# Patient Record
Sex: Female | Born: 1935 | Race: White | Hispanic: No | Marital: Married | Smoking: Never smoker
Health system: Southern US, Community
[De-identification: ages and names within clinical notes are randomized; demographics above are authoritative.]

## PROBLEM LIST (undated history)

## (undated) DIAGNOSIS — C801 Malignant (primary) neoplasm, unspecified: Secondary | ICD-10-CM

## (undated) DIAGNOSIS — N390 Urinary tract infection, site not specified: Secondary | ICD-10-CM

## (undated) DIAGNOSIS — F419 Anxiety disorder, unspecified: Secondary | ICD-10-CM

## (undated) DIAGNOSIS — I471 Supraventricular tachycardia, unspecified: Secondary | ICD-10-CM

## (undated) DIAGNOSIS — I10 Essential (primary) hypertension: Secondary | ICD-10-CM

## (undated) DIAGNOSIS — J309 Allergic rhinitis, unspecified: Secondary | ICD-10-CM

## (undated) DIAGNOSIS — G250 Essential tremor: Secondary | ICD-10-CM

## (undated) DIAGNOSIS — K5790 Diverticulosis of intestine, part unspecified, without perforation or abscess without bleeding: Secondary | ICD-10-CM

## (undated) DIAGNOSIS — R809 Proteinuria, unspecified: Secondary | ICD-10-CM

## (undated) DIAGNOSIS — E538 Deficiency of other specified B group vitamins: Secondary | ICD-10-CM

## (undated) DIAGNOSIS — N189 Chronic kidney disease, unspecified: Secondary | ICD-10-CM

## (undated) DIAGNOSIS — N6019 Diffuse cystic mastopathy of unspecified breast: Secondary | ICD-10-CM

## (undated) DIAGNOSIS — F329 Major depressive disorder, single episode, unspecified: Secondary | ICD-10-CM

## (undated) DIAGNOSIS — IMO0002 Reserved for concepts with insufficient information to code with codable children: Secondary | ICD-10-CM

## (undated) DIAGNOSIS — R4189 Other symptoms and signs involving cognitive functions and awareness: Secondary | ICD-10-CM

## (undated) DIAGNOSIS — E785 Hyperlipidemia, unspecified: Secondary | ICD-10-CM

## (undated) DIAGNOSIS — F32A Depression, unspecified: Secondary | ICD-10-CM

## (undated) DIAGNOSIS — Z972 Presence of dental prosthetic device (complete) (partial): Secondary | ICD-10-CM

## (undated) DIAGNOSIS — E119 Type 2 diabetes mellitus without complications: Secondary | ICD-10-CM

## (undated) DIAGNOSIS — F0280 Dementia in other diseases classified elsewhere without behavioral disturbance: Secondary | ICD-10-CM

## (undated) DIAGNOSIS — D649 Anemia, unspecified: Secondary | ICD-10-CM

## (undated) DIAGNOSIS — K219 Gastro-esophageal reflux disease without esophagitis: Secondary | ICD-10-CM

## (undated) DIAGNOSIS — F319 Bipolar disorder, unspecified: Secondary | ICD-10-CM

## (undated) HISTORY — PX: ABDOMINAL HYSTERECTOMY: SHX81

## (undated) HISTORY — PX: APPENDECTOMY: SHX54

## (undated) HISTORY — PX: CHOLECYSTECTOMY: SHX55

---

## 2004-09-25 ENCOUNTER — Inpatient Hospital Stay: Payer: Self-pay | Admitting: Internal Medicine

## 2004-09-25 ENCOUNTER — Other Ambulatory Visit: Payer: Self-pay

## 2008-10-21 HISTORY — PX: BLADDER SURGERY: SHX569

## 2009-07-13 ENCOUNTER — Ambulatory Visit: Payer: Self-pay | Admitting: Internal Medicine

## 2009-07-23 ENCOUNTER — Ambulatory Visit: Payer: Self-pay | Admitting: Internal Medicine

## 2009-07-27 ENCOUNTER — Ambulatory Visit: Payer: Self-pay | Admitting: Internal Medicine

## 2009-08-23 ENCOUNTER — Ambulatory Visit: Payer: Self-pay | Admitting: Internal Medicine

## 2009-09-26 ENCOUNTER — Ambulatory Visit: Payer: Self-pay | Admitting: Family Medicine

## 2009-10-25 ENCOUNTER — Ambulatory Visit: Payer: Self-pay | Admitting: Family Medicine

## 2009-11-17 ENCOUNTER — Ambulatory Visit: Payer: Self-pay | Admitting: Internal Medicine

## 2009-11-20 ENCOUNTER — Ambulatory Visit: Payer: Self-pay | Admitting: Family Medicine

## 2010-02-20 ENCOUNTER — Ambulatory Visit: Payer: Self-pay | Admitting: Internal Medicine

## 2010-03-22 ENCOUNTER — Ambulatory Visit: Payer: Self-pay | Admitting: Internal Medicine

## 2010-06-12 ENCOUNTER — Emergency Department: Payer: Self-pay | Admitting: Emergency Medicine

## 2010-11-21 ENCOUNTER — Ambulatory Visit: Payer: Self-pay | Admitting: Internal Medicine

## 2011-11-22 ENCOUNTER — Ambulatory Visit: Payer: Self-pay | Admitting: Internal Medicine

## 2012-12-09 ENCOUNTER — Ambulatory Visit: Payer: Self-pay | Admitting: Internal Medicine

## 2013-12-08 DIAGNOSIS — E1169 Type 2 diabetes mellitus with other specified complication: Secondary | ICD-10-CM | POA: Insufficient documentation

## 2013-12-08 DIAGNOSIS — N183 Chronic kidney disease, stage 3 unspecified: Secondary | ICD-10-CM | POA: Insufficient documentation

## 2013-12-08 DIAGNOSIS — E1129 Type 2 diabetes mellitus with other diabetic kidney complication: Secondary | ICD-10-CM | POA: Insufficient documentation

## 2013-12-31 ENCOUNTER — Ambulatory Visit: Payer: Self-pay | Admitting: Internal Medicine

## 2014-01-07 DIAGNOSIS — F0280 Dementia in other diseases classified elsewhere without behavioral disturbance: Secondary | ICD-10-CM | POA: Insufficient documentation

## 2014-01-25 ENCOUNTER — Encounter: Payer: Self-pay | Admitting: Neurology

## 2014-01-26 ENCOUNTER — Ambulatory Visit: Payer: Self-pay | Admitting: Neurology

## 2014-02-20 ENCOUNTER — Encounter: Payer: Self-pay | Admitting: Neurology

## 2014-03-25 DIAGNOSIS — Z794 Long term (current) use of insulin: Secondary | ICD-10-CM | POA: Insufficient documentation

## 2014-07-07 DIAGNOSIS — R079 Chest pain, unspecified: Secondary | ICD-10-CM | POA: Insufficient documentation

## 2014-11-09 ENCOUNTER — Ambulatory Visit
Admit: 2014-11-09 | Disposition: A | Discharge status: Home / Self Care | Payer: Self-pay | Attending: Gastroenterology | Admitting: Gastroenterology

## 2014-11-09 DIAGNOSIS — Z86010 Personal history of colonic polyps: Secondary | ICD-10-CM | POA: Insufficient documentation

## 2014-11-09 HISTORY — PX: COLONOSCOPY: SHX174

## 2014-11-15 LAB — SURGICAL PATHOLOGY

## 2014-12-13 ENCOUNTER — Other Ambulatory Visit: Payer: Self-pay | Admitting: Internal Medicine

## 2014-12-13 DIAGNOSIS — Z1231 Encounter for screening mammogram for malignant neoplasm of breast: Secondary | ICD-10-CM

## 2014-12-22 ENCOUNTER — Ambulatory Visit
Admission: RE | Admit: 2014-12-22 | Discharge: 2014-12-22 | Disposition: A | Discharge status: Home / Self Care | Payer: Medicare Other | Source: Ambulatory Visit | Attending: Internal Medicine | Admitting: Internal Medicine

## 2014-12-22 DIAGNOSIS — Z1231 Encounter for screening mammogram for malignant neoplasm of breast: Secondary | ICD-10-CM | POA: Diagnosis not present

## 2014-12-22 HISTORY — DX: Malignant (primary) neoplasm, unspecified: C80.1

## 2015-02-14 ENCOUNTER — Other Ambulatory Visit: Payer: Self-pay | Admitting: Neurology

## 2015-02-14 DIAGNOSIS — R296 Repeated falls: Secondary | ICD-10-CM

## 2015-02-16 DIAGNOSIS — R55 Syncope and collapse: Secondary | ICD-10-CM | POA: Insufficient documentation

## 2015-02-18 ENCOUNTER — Ambulatory Visit
Admission: RE | Admit: 2015-02-18 | Discharge: 2015-02-18 | Disposition: A | Discharge status: Home / Self Care | Payer: Medicare Other | Source: Ambulatory Visit | Attending: Neurology | Admitting: Neurology

## 2015-02-18 DIAGNOSIS — I739 Peripheral vascular disease, unspecified: Secondary | ICD-10-CM | POA: Diagnosis not present

## 2015-02-18 DIAGNOSIS — I6529 Occlusion and stenosis of unspecified carotid artery: Secondary | ICD-10-CM | POA: Diagnosis not present

## 2015-02-18 DIAGNOSIS — R296 Repeated falls: Secondary | ICD-10-CM | POA: Insufficient documentation

## 2015-04-04 ENCOUNTER — Encounter: Payer: Self-pay | Admitting: *Deleted

## 2015-04-05 ENCOUNTER — Ambulatory Visit: Payer: Medicare Other | Admitting: Anesthesiology

## 2015-04-05 ENCOUNTER — Ambulatory Visit
Admission: RE | Admit: 2015-04-05 | Discharge: 2015-04-05 | Disposition: A | Discharge status: Home / Self Care | Payer: Medicare Other | Source: Ambulatory Visit | Attending: Gastroenterology | Admitting: Gastroenterology

## 2015-04-05 ENCOUNTER — Encounter
Admission: RE | Disposition: A | Discharge status: Home / Self Care | Payer: Self-pay | Source: Ambulatory Visit | Attending: Gastroenterology

## 2015-04-05 DIAGNOSIS — G250 Essential tremor: Secondary | ICD-10-CM | POA: Insufficient documentation

## 2015-04-05 DIAGNOSIS — I129 Hypertensive chronic kidney disease with stage 1 through stage 4 chronic kidney disease, or unspecified chronic kidney disease: Secondary | ICD-10-CM | POA: Insufficient documentation

## 2015-04-05 DIAGNOSIS — E1122 Type 2 diabetes mellitus with diabetic chronic kidney disease: Secondary | ICD-10-CM | POA: Diagnosis not present

## 2015-04-05 DIAGNOSIS — K5730 Diverticulosis of large intestine without perforation or abscess without bleeding: Secondary | ICD-10-CM | POA: Insufficient documentation

## 2015-04-05 DIAGNOSIS — F419 Anxiety disorder, unspecified: Secondary | ICD-10-CM | POA: Diagnosis not present

## 2015-04-05 DIAGNOSIS — I471 Supraventricular tachycardia: Secondary | ICD-10-CM | POA: Diagnosis not present

## 2015-04-05 DIAGNOSIS — Z7982 Long term (current) use of aspirin: Secondary | ICD-10-CM | POA: Insufficient documentation

## 2015-04-05 DIAGNOSIS — E538 Deficiency of other specified B group vitamins: Secondary | ICD-10-CM | POA: Insufficient documentation

## 2015-04-05 DIAGNOSIS — D120 Benign neoplasm of cecum: Secondary | ICD-10-CM | POA: Insufficient documentation

## 2015-04-05 DIAGNOSIS — Z86010 Personal history of colonic polyps: Secondary | ICD-10-CM | POA: Insufficient documentation

## 2015-04-05 DIAGNOSIS — E785 Hyperlipidemia, unspecified: Secondary | ICD-10-CM | POA: Insufficient documentation

## 2015-04-05 DIAGNOSIS — N189 Chronic kidney disease, unspecified: Secondary | ICD-10-CM | POA: Insufficient documentation

## 2015-04-05 DIAGNOSIS — Z888 Allergy status to other drugs, medicaments and biological substances status: Secondary | ICD-10-CM | POA: Insufficient documentation

## 2015-04-05 DIAGNOSIS — Z1211 Encounter for screening for malignant neoplasm of colon: Secondary | ICD-10-CM | POA: Insufficient documentation

## 2015-04-05 DIAGNOSIS — Z885 Allergy status to narcotic agent status: Secondary | ICD-10-CM | POA: Insufficient documentation

## 2015-04-05 DIAGNOSIS — G3184 Mild cognitive impairment, so stated: Secondary | ICD-10-CM | POA: Insufficient documentation

## 2015-04-05 DIAGNOSIS — K6389 Other specified diseases of intestine: Secondary | ICD-10-CM | POA: Insufficient documentation

## 2015-04-05 DIAGNOSIS — F319 Bipolar disorder, unspecified: Secondary | ICD-10-CM | POA: Diagnosis not present

## 2015-04-05 DIAGNOSIS — Z87440 Personal history of urinary (tract) infections: Secondary | ICD-10-CM | POA: Diagnosis not present

## 2015-04-05 DIAGNOSIS — Z85828 Personal history of other malignant neoplasm of skin: Secondary | ICD-10-CM | POA: Insufficient documentation

## 2015-04-05 DIAGNOSIS — K219 Gastro-esophageal reflux disease without esophagitis: Secondary | ICD-10-CM | POA: Diagnosis not present

## 2015-04-05 HISTORY — DX: Allergic rhinitis, unspecified: J30.9

## 2015-04-05 HISTORY — DX: Other symptoms and signs involving cognitive functions and awareness: R41.89

## 2015-04-05 HISTORY — PX: COLONOSCOPY WITH PROPOFOL: SHX5780

## 2015-04-05 HISTORY — DX: Diffuse cystic mastopathy of unspecified breast: N60.19

## 2015-04-05 HISTORY — DX: Supraventricular tachycardia, unspecified: I47.10

## 2015-04-05 HISTORY — DX: Hyperlipidemia, unspecified: E78.5

## 2015-04-05 HISTORY — DX: Essential (primary) hypertension: I10

## 2015-04-05 HISTORY — DX: Reserved for concepts with insufficient information to code with codable children: IMO0002

## 2015-04-05 HISTORY — DX: Anemia, unspecified: D64.9

## 2015-04-05 HISTORY — DX: Supraventricular tachycardia: I47.1

## 2015-04-05 HISTORY — DX: Type 2 diabetes mellitus without complications: E11.9

## 2015-04-05 HISTORY — DX: Proteinuria, unspecified: R80.9

## 2015-04-05 HISTORY — DX: Deficiency of other specified B group vitamins: E53.8

## 2015-04-05 HISTORY — DX: Chronic kidney disease, unspecified: N18.9

## 2015-04-05 HISTORY — DX: Urinary tract infection, site not specified: N39.0

## 2015-04-05 HISTORY — DX: Anxiety disorder, unspecified: F41.9

## 2015-04-05 HISTORY — DX: Bipolar disorder, unspecified: F31.9

## 2015-04-05 HISTORY — DX: Essential tremor: G25.0

## 2015-04-05 HISTORY — DX: Gastro-esophageal reflux disease without esophagitis: K21.9

## 2015-04-05 HISTORY — DX: Diverticulosis of intestine, part unspecified, without perforation or abscess without bleeding: K57.90

## 2015-04-05 LAB — GLUCOSE, CAPILLARY: Glucose-Capillary: 153 mg/dL — ABNORMAL HIGH (ref 65–99)

## 2015-04-05 SURGERY — COLONOSCOPY WITH PROPOFOL
Anesthesia: General

## 2015-04-05 MED ORDER — SODIUM CHLORIDE 0.9 % IV SOLN
INTRAVENOUS | Status: DC
  Administered 2015-04-05: 1000 mL via INTRAVENOUS

## 2015-04-05 MED ORDER — PROPOFOL INFUSION 10 MG/ML OPTIME
INTRAVENOUS | Status: DC | PRN
  Administered 2015-04-05: 140 ug/kg/min via INTRAVENOUS

## 2015-04-05 MED ORDER — MIDAZOLAM HCL 5 MG/5ML IJ SOLN
INTRAMUSCULAR | Status: DC | PRN
  Administered 2015-04-05: 1 mg via INTRAVENOUS

## 2015-04-05 MED ORDER — FENTANYL CITRATE (PF) 100 MCG/2ML IJ SOLN
INTRAMUSCULAR | Status: DC | PRN
  Administered 2015-04-05: 50 ug via INTRAVENOUS

## 2015-04-05 MED ORDER — PROPOFOL 10 MG/ML IV BOLUS
INTRAVENOUS | Status: DC | PRN
  Administered 2015-04-05: 30 mg via INTRAVENOUS

## 2015-04-05 NOTE — Anesthesia Postprocedure Evaluation (Signed)
  Anesthesia Post-op Note  Patient: Sharon Dickson  Procedure(s) Performed: Procedure(s): COLONOSCOPY WITH PROPOFOL (N/A)  Anesthesia type:General  Patient location: PACU  Post pain: Pain level controlled  Post assessment: Post-op Vital signs reviewed, Patient's Cardiovascular Status Stable, Respiratory Function Stable, Patent Airway and No signs of Nausea or vomiting  Post vital signs: Reviewed and stable  Last Vitals:  Filed Vitals:   04/05/15 1036  BP: 164/85  Temp: 37.2 C  Resp: 20    Level of consciousness: awake, alert  and patient cooperative  Complications: No apparent anesthesia complications

## 2015-04-05 NOTE — Transfer of Care (Signed)
Immediate Anesthesia Transfer of Care Note  Patient: Sharon Dickson  Procedure(s) Performed: Procedure(s): COLONOSCOPY WITH PROPOFOL (N/A)  Patient Location: PACU  Anesthesia Type:General  Level of Consciousness: sedated  Airway & Oxygen Therapy: Patient Spontanous Breathing  Post-op Assessment: Report given to RN  Post vital signs: stable  Last Vitals:  Filed Vitals:   04/05/15 1036  BP: 164/85  Temp: 37.2 C  Resp: 20    Complications: No apparent anesthesia complications

## 2015-04-05 NOTE — Anesthesia Preprocedure Evaluation (Addendum)
Anesthesia Evaluation  Patient identified by MRN, date of birth, ID band Patient awake    Reviewed: Allergy & Precautions, NPO status , Patient's Chart, lab work & pertinent test results  History of Anesthesia Complications Negative for: history of anesthetic complications  Airway Mallampati: II  TM Distance: >3 FB Neck ROM: Full    Dental  (+) Partial Upper, Missing   Pulmonary neg pulmonary ROS,           Cardiovascular hypertension, Pt. on medications and Pt. on home beta blockers + dysrhythmias (Hx of SVT) Supra Ventricular Tachycardia      Neuro/Psych Anxiety Bipolar Disorder PT with memory loss.     GI/Hepatic GERD  Medicated,  Endo/Other  diabetes, Type 2, Oral Hypoglycemic Agents  Renal/GU CRFRenal disease     Musculoskeletal   Abdominal   Peds  Hematology   Anesthesia Other Findings   Reproductive/Obstetrics                            Anesthesia Physical Anesthesia Plan  ASA: III  Anesthesia Plan: General   Post-op Pain Management:    Induction: Intravenous  Airway Management Planned:   Additional Equipment:   Intra-op Plan:   Post-operative Plan:   Informed Consent: I have reviewed the patients History and Physical, chart, labs and discussed the procedure including the risks, benefits and alternatives for the proposed anesthesia with the patient or authorized representative who has indicated his/her understanding and acceptance.     Plan Discussed with:   Anesthesia Plan Comments:         Anesthesia Quick Evaluation

## 2015-04-05 NOTE — Op Note (Signed)
[facility] Gastroenterology Patient Name: Sharon Dickson Procedure Date: 04/05/2015 11:05 AM MRN: [id number] Account #: [id number] Date of Birth: [birth year] Admit Type: Outpatient Age: 81 Room: [facility] ENDO ROOM 3 Gender: Female Note Status: Finalized Procedure:         Colonoscopy Indications:       Personal history of colonic polyps Providers:         Lollie Sails, MD Referring MD:      Christena Flake. Raechel Ache, MD (Referring MD) Medicines:         Monitored Anesthesia Care Complications:     No immediate complications. Procedure:         Pre-Anesthesia Assessment:                    - ASA Grade Assessment: III - A patient with severe                     systemic disease.                    After obtaining informed consent, the colonoscope was                     passed under direct vision. Throughout the procedure, the                     patient's blood pressure, pulse, and oxygen saturations                     were monitored continuously. The Colonoscope was                     introduced through the anus and advanced to the the cecum,                     identified by appendiceal orifice and ileocecal valve. The                     colonoscopy was unusually difficult due to multiple                     diverticula in the colon and a tortuous colon. Successful                     completion of the procedure was aided by changing the                     patient to a supine position, changing the patient to a                     prone position and using manual pressure. The patient                     tolerated the procedure well. Findings:      The sigmoid colon, descending colon and transverse colon were       significantly redundant.      Many small and large-mouthed diverticula were found in the sigmoid       colon, in the descending colon, in the transverse colon and at the       hepatic flexure.      A 20 mm polyp was found in the cecum. The polyp was  sessile. Biopsies       were taken with a cold forceps for histology.  The digital rectal exam was normal.      A Carbon medium-mouthed diverticula were found in the sigmoid colon and in       the distal sigmoid colon. Purulent discharge was seen in association       with the diverticular opening, suspicious of diverticulitis.      the area of previously noted granular tissue in the sigmoid adjacent to       a diverticular opening was not noted. Impression:        - Redundant colon.                    - Diverticulosis in the sigmoid colon, in the descending                     colon, in the transverse colon and at the hepatic flexure.                    - One 20 mm polyp in the cecum. Biopsied.                    - Mild diverticulosis in the sigmoid colon and in the                     distal sigmoid colon. Purulent discharge was seen in                     association with the diverticular opening, suspicious of                     diverticulitis. Recommendation:    - Await pathology results.                    - Discharge patient to home.                    - Refer to a colo-rectal surgeon at appointment to be                     scheduled. Discussed with Dr Rochel Brome. Procedure Code(s): --- Professional ---                    [phone], Colonoscopy, flexible; with biopsy, single or                     multiple Diagnosis Code(s): --- Professional ---                    211.3, Benign neoplasm of colon                    V12.72, Personal history of colonic polyps                    562.10, Diverticulosis of colon (without mention of                     hemorrhage)                    751.5, Other anomalies of intestine CPT copyright 2014 American [employer]. All rights reserved. The codes documented in this report are preliminary and upon coder review may  be revised to meet current compliance requirements. Lollie Sails, MD 04/05/2015 12:09:04 PM This report has been  signed electronically. Number of Addenda: 0 Note Initiated On: 04/05/2015 11:05  AM Scope Withdrawal Time: 0 hours 15 minutes 51 seconds  Total Procedure Duration: 0 hours 41 minutes 44 seconds       [facility]

## 2015-04-05 NOTE — H&P (Signed)
Outpatient short stay form Pre-procedure 04/05/2015 11:02 AM Lollie Sails MD  Primary Physician: Dr. Genene Churn  Reason for visit:  Colonoscopy  History of present illness:  Patient is a 81 year old female sending for colonoscopy in regards to a loosely noted polyp. His was partially removed quite difficult to get to. Surgical opinion was to try to repeat the colonoscopy. She tolerated her prep well. She is taking no coagulation medications or aspirin products.    Current facility-administered medications:  .  0.9 %  sodium chloride infusion, , Intravenous, Continuous, Lollie Sails, MD, Last Rate: 20 mL/hr at 04/05/15 1056, 1,000 mL at 04/05/15 1056  Prescriptions prior to admission  Medication Sig Dispense Refill Last Dose  . aspirin 81 MG tablet Take 81 mg by mouth daily.   Past Week at Unknown time  . Biotin 1 MG CAPS Take by mouth.   Past Week at Unknown time  . cholecalciferol (D-VI-SOL) 400 UNIT/ML LIQD Take 400 Units by mouth daily.   Past Week at Unknown time  . Cyanocobalamin 1000 MCG/ML KIT Inject 1,000 mcg as directed every 30 (thirty) days.   Past Week at Unknown time  . donepezil (ARICEPT) 10 MG tablet Take 10 mg by mouth at bedtime.   Past Week at Unknown time  . losartan (COZAAR) 50 MG tablet Take 50 mg by mouth daily.   Past Week at Unknown time  . metFORMIN (GLUCOPHAGE) 500 MG tablet Take 500 mg by mouth 2 (two) times daily with a meal.   Past Week at Unknown time  . omeprazole (PRILOSEC) 20 MG capsule Take 20 mg by mouth daily.   Past Week at Unknown time  . primidone (MYSOLINE) 50 MG tablet Take by mouth 2 times daily at 12 noon and 4 pm.   Past Week at Unknown time  . propranolol (INDERAL) 40 MG tablet Take 40 mg by mouth 2 (two) times daily.   Past Week at Unknown time  . venlafaxine (EFFEXOR) 75 MG tablet Take 75 mg by mouth 2 (two) times daily.   Past Week at Unknown time  . vitamin E 400 UNIT capsule Take 400 Units by mouth daily.   Past Week at Unknown  time  . fluticasone (FLONASE) 50 MCG/ACT nasal spray Place 2 sprays into both nostrils daily.   Not Taking at Unknown time     Allergies  Allergen Reactions  . Ceftin [Cefuroxime Axetil]   . Codeine   . Metformin And Related   . Paxil [Paroxetine Hcl]   . Prozac [Fluoxetine Hcl]      Past Medical History  Diagnosis Date  . Cancer     skin ca  . Chronic kidney disease   . GERD (gastroesophageal reflux disease)   . Bipolar disorder   . Anxiety   . Hypertension   . Diabetes mellitus without complication   . Hyperlipidemia   . Proteinuria   . Allergic rhinitis   . Benign essential tremor   . Anemia   . Vitamin B 12 deficiency   . Cognitive impairment   . UTI (urinary tract infection)   . SVT (supraventricular tachycardia)   . Cystocele   . Cystic disease of breast   . Diverticulosis     Review of systems:      Physical Exam    Heart and lungs: Regular rate and rhythm without rub or gallop, lungs are bilaterally clear    HEENT: Normocephalic atraumatic eyes are anicteric    Other:  Pertinant exam for procedure: Soft nontender nondistended bowel sounds positive normoactive    Planned proceedures: Colonoscopy and indicated procedures I have discussed the risks benefits and complications of procedures to include not limited to bleeding, infection, perforation and the risk of sedation and the patient wishes to proceed.    Lollie Sails, MD Gastroenterology 04/05/2015  11:02 AM

## 2015-04-06 ENCOUNTER — Encounter: Payer: Self-pay | Admitting: Gastroenterology

## 2015-04-06 LAB — SURGICAL PATHOLOGY

## 2015-04-13 ENCOUNTER — Emergency Department: Payer: Medicare Other

## 2015-04-13 ENCOUNTER — Observation Stay
Admission: EM | Admit: 2015-04-13 | Discharge: 2015-04-15 | Disposition: A | Discharge status: Home / Self Care | Payer: Medicare Other | Attending: Internal Medicine | Admitting: Internal Medicine

## 2015-04-13 ENCOUNTER — Other Ambulatory Visit: Payer: Self-pay

## 2015-04-13 ENCOUNTER — Encounter: Payer: Self-pay | Admitting: Emergency Medicine

## 2015-04-13 DIAGNOSIS — K219 Gastro-esophageal reflux disease without esophagitis: Principal | ICD-10-CM | POA: Insufficient documentation

## 2015-04-13 DIAGNOSIS — R001 Bradycardia, unspecified: Secondary | ICD-10-CM | POA: Diagnosis present

## 2015-04-13 DIAGNOSIS — Z794 Long term (current) use of insulin: Secondary | ICD-10-CM | POA: Insufficient documentation

## 2015-04-13 DIAGNOSIS — N189 Chronic kidney disease, unspecified: Secondary | ICD-10-CM | POA: Diagnosis not present

## 2015-04-13 DIAGNOSIS — Z87440 Personal history of urinary (tract) infections: Secondary | ICD-10-CM | POA: Insufficient documentation

## 2015-04-13 DIAGNOSIS — J309 Allergic rhinitis, unspecified: Secondary | ICD-10-CM | POA: Diagnosis not present

## 2015-04-13 DIAGNOSIS — K625 Hemorrhage of anus and rectum: Secondary | ICD-10-CM | POA: Diagnosis not present

## 2015-04-13 DIAGNOSIS — I2510 Atherosclerotic heart disease of native coronary artery without angina pectoris: Secondary | ICD-10-CM | POA: Diagnosis not present

## 2015-04-13 DIAGNOSIS — R42 Dizziness and giddiness: Secondary | ICD-10-CM | POA: Diagnosis not present

## 2015-04-13 DIAGNOSIS — Z9049 Acquired absence of other specified parts of digestive tract: Secondary | ICD-10-CM | POA: Diagnosis not present

## 2015-04-13 DIAGNOSIS — Z79899 Other long term (current) drug therapy: Secondary | ICD-10-CM | POA: Insufficient documentation

## 2015-04-13 DIAGNOSIS — G250 Essential tremor: Secondary | ICD-10-CM | POA: Insufficient documentation

## 2015-04-13 DIAGNOSIS — K449 Diaphragmatic hernia without obstruction or gangrene: Secondary | ICD-10-CM | POA: Diagnosis not present

## 2015-04-13 DIAGNOSIS — F319 Bipolar disorder, unspecified: Secondary | ICD-10-CM | POA: Insufficient documentation

## 2015-04-13 DIAGNOSIS — Z885 Allergy status to narcotic agent status: Secondary | ICD-10-CM | POA: Diagnosis not present

## 2015-04-13 DIAGNOSIS — E538 Deficiency of other specified B group vitamins: Secondary | ICD-10-CM | POA: Diagnosis not present

## 2015-04-13 DIAGNOSIS — E785 Hyperlipidemia, unspecified: Secondary | ICD-10-CM | POA: Diagnosis not present

## 2015-04-13 DIAGNOSIS — Z7951 Long term (current) use of inhaled steroids: Secondary | ICD-10-CM | POA: Insufficient documentation

## 2015-04-13 DIAGNOSIS — Z90710 Acquired absence of both cervix and uterus: Secondary | ICD-10-CM | POA: Insufficient documentation

## 2015-04-13 DIAGNOSIS — K921 Melena: Secondary | ICD-10-CM | POA: Insufficient documentation

## 2015-04-13 DIAGNOSIS — Z881 Allergy status to other antibiotic agents status: Secondary | ICD-10-CM | POA: Insufficient documentation

## 2015-04-13 DIAGNOSIS — K922 Gastrointestinal hemorrhage, unspecified: Secondary | ICD-10-CM | POA: Diagnosis present

## 2015-04-13 DIAGNOSIS — I129 Hypertensive chronic kidney disease with stage 1 through stage 4 chronic kidney disease, or unspecified chronic kidney disease: Secondary | ICD-10-CM | POA: Diagnosis not present

## 2015-04-13 DIAGNOSIS — I700 Atherosclerosis of aorta: Secondary | ICD-10-CM | POA: Insufficient documentation

## 2015-04-13 DIAGNOSIS — E1122 Type 2 diabetes mellitus with diabetic chronic kidney disease: Secondary | ICD-10-CM | POA: Insufficient documentation

## 2015-04-13 DIAGNOSIS — F419 Anxiety disorder, unspecified: Secondary | ICD-10-CM | POA: Diagnosis not present

## 2015-04-13 DIAGNOSIS — R948 Abnormal results of function studies of other organs and systems: Secondary | ICD-10-CM | POA: Insufficient documentation

## 2015-04-13 DIAGNOSIS — I471 Supraventricular tachycardia: Secondary | ICD-10-CM | POA: Diagnosis not present

## 2015-04-13 DIAGNOSIS — K5730 Diverticulosis of large intestine without perforation or abscess without bleeding: Secondary | ICD-10-CM | POA: Diagnosis not present

## 2015-04-13 DIAGNOSIS — D649 Anemia, unspecified: Secondary | ICD-10-CM | POA: Diagnosis present

## 2015-04-13 DIAGNOSIS — Z888 Allergy status to other drugs, medicaments and biological substances status: Secondary | ICD-10-CM | POA: Diagnosis not present

## 2015-04-13 DIAGNOSIS — Z85828 Personal history of other malignant neoplasm of skin: Secondary | ICD-10-CM | POA: Diagnosis not present

## 2015-04-13 DIAGNOSIS — R1031 Right lower quadrant pain: Secondary | ICD-10-CM | POA: Diagnosis not present

## 2015-04-13 DIAGNOSIS — M5126 Other intervertebral disc displacement, lumbar region: Secondary | ICD-10-CM | POA: Diagnosis not present

## 2015-04-13 LAB — COMPREHENSIVE METABOLIC PANEL
ALBUMIN: 3.3 g/dL — AB (ref 3.5–5.0)
ALT: 21 U/L (ref 14–54)
ANION GAP: 5 (ref 5–15)
AST: 28 U/L (ref 15–41)
Alkaline Phosphatase: 59 U/L (ref 38–126)
BUN: 17 mg/dL (ref 6–20)
CHLORIDE: 109 mmol/L (ref 101–111)
CO2: 23 mmol/L (ref 22–32)
Calcium: 8.5 mg/dL — ABNORMAL LOW (ref 8.9–10.3)
Creatinine, Ser: 1.04 mg/dL — ABNORMAL HIGH (ref 0.44–1.00)
GFR calc non Af Amer: 50 mL/min — ABNORMAL LOW (ref >60)
GFR, EST AFRICAN AMERICAN: 58 mL/min — AB (ref >60)
GLUCOSE: 191 mg/dL — AB (ref 65–99)
Potassium: 4 mmol/L (ref 3.5–5.1)
SODIUM: 137 mmol/L (ref 135–145)
Total Bilirubin: 0.3 mg/dL (ref 0.3–1.2)
Total Protein: 6.5 g/dL (ref 6.5–8.1)

## 2015-04-13 LAB — CBC
HCT: 30 % — ABNORMAL LOW (ref 35.0–47.0)
HEMOGLOBIN: 10.1 g/dL — AB (ref 12.0–16.0)
MCH: 30.5 pg (ref 26.0–34.0)
MCHC: 33.8 g/dL (ref 32.0–36.0)
MCV: 90.2 fL (ref 80.0–100.0)
Platelets: 306 10*3/uL (ref 150–440)
RBC: 3.33 MIL/uL — AB (ref 3.80–5.20)
RDW: 14.8 % — ABNORMAL HIGH (ref 11.5–14.5)
WBC: 6.1 10*3/uL (ref 3.6–11.0)

## 2015-04-13 LAB — TYPE AND SCREEN
ABO/RH(D): O NEG
Antibody Screen: NEGATIVE

## 2015-04-13 LAB — GLUCOSE, CAPILLARY: Glucose-Capillary: 76 mg/dL (ref 65–99)

## 2015-04-13 LAB — TROPONIN I

## 2015-04-13 LAB — ABO/RH: ABO/RH(D): O NEG

## 2015-04-13 MED ORDER — VENLAFAXINE HCL ER 75 MG PO CP24
75.0000 mg | ORAL_CAPSULE | Freq: Every evening | ORAL | Status: DC
  Administered 2015-04-13 – 2015-04-14 (×2): 75 mg via ORAL
  Filled 2015-04-13 (×2): qty 1

## 2015-04-13 MED ORDER — VENLAFAXINE HCL ER 75 MG PO CP24
150.0000 mg | ORAL_CAPSULE | Freq: Every morning | ORAL | Status: DC
  Administered 2015-04-14 – 2015-04-15 (×2): 150 mg via ORAL
  Filled 2015-04-13 (×2): qty 2

## 2015-04-13 MED ORDER — IOHEXOL 240 MG/ML SOLN
25.0000 mL | INTRAMUSCULAR | Status: AC
  Administered 2015-04-13 (×2): 25 mL via ORAL

## 2015-04-13 MED ORDER — PROPRANOLOL HCL 40 MG PO TABS
40.0000 mg | ORAL_TABLET | Freq: Two times a day (BID) | ORAL | Status: DC
  Administered 2015-04-13 – 2015-04-15 (×4): 40 mg via ORAL
  Filled 2015-04-13 (×4): qty 1

## 2015-04-13 MED ORDER — FLUTICASONE PROPIONATE 50 MCG/ACT NA SUSP
2.0000 | Freq: Every day | NASAL | Status: DC | PRN
  Filled 2015-04-13: qty 16

## 2015-04-13 MED ORDER — SODIUM CHLORIDE 0.9 % IV SOLN: INTRAVENOUS | Status: DC

## 2015-04-13 MED ORDER — IOHEXOL 240 MG/ML SOLN: 25.0000 mL | Freq: Once | INTRAMUSCULAR | Status: DC | PRN

## 2015-04-13 MED ORDER — ONDANSETRON HCL 4 MG PO TABS: 4.0000 mg | ORAL_TABLET | Freq: Four times a day (QID) | ORAL | Status: DC | PRN

## 2015-04-13 MED ORDER — INSULIN ASPART 100 UNIT/ML ~~LOC~~ SOLN: 0.0000 [IU] | Freq: Every day | SUBCUTANEOUS | Status: DC

## 2015-04-13 MED ORDER — ALBUTEROL SULFATE (2.5 MG/3ML) 0.083% IN NEBU: 2.5000 mg | INHALATION_SOLUTION | RESPIRATORY_TRACT | Status: DC | PRN

## 2015-04-13 MED ORDER — DEXTROSE-NACL 5-0.9 % IV SOLN
INTRAVENOUS | Status: DC
  Administered 2015-04-13 – 2015-04-15 (×3): via INTRAVENOUS

## 2015-04-13 MED ORDER — ACETAMINOPHEN 325 MG PO TABS: 650.0000 mg | ORAL_TABLET | Freq: Four times a day (QID) | ORAL | Status: DC | PRN

## 2015-04-13 MED ORDER — SODIUM CHLORIDE 0.9 % IV BOLUS (SEPSIS)
500.0000 mL | Freq: Once | INTRAVENOUS | Status: AC
  Administered 2015-04-13: 500 mL via INTRAVENOUS

## 2015-04-13 MED ORDER — ACETAMINOPHEN 650 MG RE SUPP: 650.0000 mg | Freq: Four times a day (QID) | RECTAL | Status: DC | PRN

## 2015-04-13 MED ORDER — PRIMIDONE 50 MG PO TABS
50.0000 mg | ORAL_TABLET | Freq: Two times a day (BID) | ORAL | Status: DC
  Administered 2015-04-13 – 2015-04-15 (×4): 50 mg via ORAL
  Filled 2015-04-13 (×5): qty 1

## 2015-04-13 MED ORDER — IOHEXOL 300 MG/ML  SOLN
75.0000 mL | Freq: Once | INTRAMUSCULAR | Status: AC | PRN
  Administered 2015-04-13: 75 mL via INTRAVENOUS

## 2015-04-13 MED ORDER — ONDANSETRON HCL 4 MG/2ML IJ SOLN: 4.0000 mg | Freq: Four times a day (QID) | INTRAMUSCULAR | Status: DC | PRN

## 2015-04-13 MED ORDER — DONEPEZIL HCL 5 MG PO TABS
10.0000 mg | ORAL_TABLET | Freq: Every day | ORAL | Status: DC
  Administered 2015-04-13 – 2015-04-14 (×2): 10 mg via ORAL
  Filled 2015-04-13 (×2): qty 2

## 2015-04-13 MED ORDER — PANTOPRAZOLE SODIUM 40 MG IV SOLR
40.0000 mg | Freq: Two times a day (BID) | INTRAVENOUS | Status: DC
  Administered 2015-04-13 – 2015-04-14 (×3): 40 mg via INTRAVENOUS
  Filled 2015-04-13 (×4): qty 40

## 2015-04-13 MED ORDER — INSULIN ASPART 100 UNIT/ML ~~LOC~~ SOLN
0.0000 [IU] | Freq: Three times a day (TID) | SUBCUTANEOUS | Status: DC
  Administered 2015-04-14 – 2015-04-15 (×3): 2 [IU] via SUBCUTANEOUS
  Filled 2015-04-13 (×3): qty 2

## 2015-04-13 MED ORDER — CIPROFLOXACIN HCL 250 MG PO TABS
500.0000 mg | ORAL_TABLET | Freq: Two times a day (BID) | ORAL | Status: DC
  Administered 2015-04-13 – 2015-04-15 (×4): 500 mg via ORAL
  Filled 2015-04-13 (×4): qty 2

## 2015-04-13 MED ORDER — LOSARTAN POTASSIUM 50 MG PO TABS
50.0000 mg | ORAL_TABLET | Freq: Every day | ORAL | Status: DC
  Administered 2015-04-13 – 2015-04-15 (×3): 50 mg via ORAL
  Filled 2015-04-13 (×3): qty 1

## 2015-04-13 NOTE — ED Notes (Signed)
BG 68

## 2015-04-13 NOTE — ED Notes (Signed)
Patient reports having colonoscopy 8 days ago. Noticed today having very dark stool.

## 2015-04-13 NOTE — ED Notes (Signed)
Pt drinking second bottle of contrast and then will go for CT Scan.

## 2015-04-13 NOTE — ED Notes (Signed)
Pt called out stating she felt her sugar was low. Went to check BG and it was 59. Will make Dr. Mariea Clonts aware.

## 2015-04-13 NOTE — ED Notes (Signed)
Pt had large amount of diarrhea after CT scan performed. Pt cleaned, linen changed. Stool loose, yellow/brown in nature. No blood or black tarry stool noted.

## 2015-04-13 NOTE — Progress Notes (Signed)
On call MD notified blood sugar low in ED and came up to 68 but inpatient orders for NPO. Telephone order received & read back for D5NS @ 75. Will continue to assess.

## 2015-04-13 NOTE — ED Provider Notes (Signed)
[facility] Emergency Department Provider Note   ____________________________________________  Time seen: Approximately 3:51 PM  I have reviewed the triage vital signs and the nursing notes.   HISTORY  Chief Complaint Rectal Bleeding    HPI Sharon Dickson is a 81 y.o. female with a history of anemia, 8 days S/P colonoscopy with polyp biopsy and initiation of Cipro and Flagyl for diverticulitis presenting with melena. Patient states that every day since her procedure she has been having black, tarry color-looking stools. She also has intermittent mild right lower quadrant pain; none now. She reports lightheadedness both at rest and with exertion. She denies any chest pain, palpitations, nausea or vomiting, abdominal distention,fever, chills.   Past Medical History  Diagnosis Date  . Cancer     skin ca  . Chronic kidney disease   . GERD (gastroesophageal reflux disease)   . Bipolar disorder   . Anxiety   . Hypertension   . Diabetes mellitus without complication   . Hyperlipidemia   . Proteinuria   . Allergic rhinitis   . Benign essential tremor   . Anemia   . Vitamin B 12 deficiency   . Cognitive impairment   . UTI (urinary tract infection)   . SVT (supraventricular tachycardia)   . Cystocele   . Cystic disease of breast   . Diverticulosis     There are no active problems to display for this patient.   Past Surgical History  Procedure Laterality Date  . Appendectomy    . Abdominal hysterectomy    . Cholecystectomy    . Bladder surgery  10/2008  . Colonoscopy  11/09/2014  . Colonoscopy with propofol N/A 04/05/2015    Procedure: COLONOSCOPY WITH PROPOFOL;  Surgeon: Lollie Sails, MD;  Location: [facility] ENDOSCOPY;  Service: Endoscopy;  Laterality: N/A;    Current Outpatient Rx  Name  Route  Sig  Dispense  Refill  . Biotin 1 MG CAPS   Oral   Take 1 mg by mouth daily.          . cholecalciferol (VITAMIN D) 1000 UNITS tablet    Oral   Take 1,000 Units by mouth daily.         . ciprofloxacin (CIPRO) 500 MG tablet   Oral   Take 500 mg by mouth 2 (two) times daily.         . cyanocobalamin (,VITAMIN B-12,) 1000 MCG/ML injection   Intramuscular   Inject 1,000 mcg into the muscle every 30 (thirty) days. Pt uses on the 3rd of every month.         . donepezil (ARICEPT) 10 MG tablet   Oral   Take 10 mg by mouth at bedtime.         . fluticasone (FLONASE) 50 MCG/ACT nasal spray   Each Nare   Place 2 sprays into both nostrils daily as needed for rhinitis.          Marland Kitchen insulin glargine (LANTUS) 100 UNIT/ML injection   Subcutaneous   Inject 14 Units into the skin at bedtime.         Marland Kitchen losartan (COZAAR) 50 MG tablet   Oral   Take 50 mg by mouth daily.         . metFORMIN (GLUCOPHAGE-XR) 500 MG 24 hr tablet   Oral   Take 500-1,000 mg by mouth 2 (two) times daily. Pt takes two in the morning and one in the evening.         Marland Kitchen  omeprazole (PRILOSEC) 20 MG capsule   Oral   Take 20 mg by mouth at bedtime.          . primidone (MYSOLINE) 50 MG tablet   Oral   Take 50 mg by mouth 2 (two) times daily.          . propranolol (INDERAL) 40 MG tablet   Oral   Take 40 mg by mouth 2 (two) times daily.         Marland Kitchen venlafaxine XR (EFFEXOR-XR) 75 MG 24 hr capsule   Oral   Take 75-150 mg by mouth 2 (two) times daily. Pt takes two in the morning and one at night.         . vitamin E 400 UNIT capsule   Oral   Take 400 Units by mouth daily.           Allergies Ceftin; Codeine; Paxil; and Prozac  Family History  Problem Relation Age of Onset  . Breast cancer Daughter 57    Social History Social History  Substance Use Topics  . Smoking status: Never Smoker   . Smokeless tobacco: Never Used  . Alcohol Use: No    Review of Systems Constitutional: No fever/chills. Eyes: No visual changes. ENT: No sore throat. Cardiovascular: Denies chest pain, palpitations. Positive  lightheadedness Respiratory: Denies shortness of breath.  No cough. Gastrointestinal: Intermittent right lower abdominal pain.  No nausea, no vomiting.  No diarrhea.  No constipation. Genitourinary: Negative for dysuria. Musculoskeletal: Negative for back pain. Skin: Negative for rash. Neurological: Negative for headaches, focal weakness or numbness.  10-point ROS otherwise negative.  ____________________________________________   PHYSICAL EXAM:  VITAL SIGNS: ED Triage Vitals  Enc Vitals Group     BP 04/13/15 1346 142/65 mmHg     Pulse Rate 04/13/15 1346 58     Resp 04/13/15 1346 18     Temp 04/13/15 1346 98.4 F (36.9 C)     Temp Source 04/13/15 1346 Oral     SpO2 04/13/15 1346 96 %     Weight 04/13/15 1346 126 lb (57.153 kg)     Height 04/13/15 1346 5\' 4"  (1.626 m)     Head Cir --      Peak Flow --      Pain Score 04/13/15 1346 0     Pain Loc --      Pain Edu? --      Excl. in [place]? --     Constitutional: Alert and oriented. Well appearing and in no acute distress. Answer question appropriately. Eyes: Conjunctivae are normal.  EOMI. Head: Atraumatic. Nose: No congestion/rhinnorhea. Mouth/Throat: Mucous membranes are moist.  Neck: No stridor.  Supple.   Cardiovascular: Normal rate, regular rhythm. No murmurs, rubs or gallops.  Respiratory: Normal respiratory effort.  No retractions. Lungs CTAB.  No wheezes, rales or ronchi. Gastrointestinal: Soft and nontender. No distention. No peritoneal signs. Genitourinary: Rectal exam shows nonthrombosed, nonbleeding external hemorrhoids. Green stool is guaiac negative. No palpable internal hemorrhoids. Musculoskeletal: No LE edema.  Neurologic:  Normal speech and language. No gross focal neurologic deficits are appreciated.  Skin:  Skin is warm, dry and intact. No rash noted. Psychiatric: Mood and affect are normal. Speech and behavior are normal.  Normal judgement.  ____________________________________________   LABS (all  labs ordered are listed, but only abnormal results are displayed)  Labs Reviewed  COMPREHENSIVE METABOLIC PANEL - Abnormal; Notable for the following:    Glucose, Bld 191 (*)    Creatinine, Ser 1.04 (*)  Calcium 8.5 (*)    Albumin 3.3 (*)    GFR calc non Af Amer 50 (*)    GFR calc Af Amer 58 (*)    All other components within normal limits  CBC - Abnormal; Notable for the following:    RBC 3.33 (*)    Hemoglobin 10.1 (*)    HCT 30.0 (*)    RDW 14.8 (*)    All other components within normal limits  TROPONIN I  TYPE AND SCREEN  ABO/RH   ____________________________________________  EKG  ED ECG REPORT I, Eula Listen, the attending physician, personally viewed and interpreted this ECG.   Date: 04/13/2015  EKG Time: 16:18  Rate: 58  Rhythm: sinus bradycardia  Axis: normal   Intervals:none  ST&T Change: Nonspecific T wave inversions in V1.  ____________________________________________  RADIOLOGY  Ct Abdomen Pelvis W Contrast  04/13/2015   CLINICAL DATA:  Patient reports having colonoscopy 8 days ago. Noticed today having very dark stool.  EXAM: CT ABDOMEN AND PELVIS WITH CONTRAST  TECHNIQUE: Multidetector CT imaging of the abdomen and pelvis was performed using the standard protocol following bolus administration of intravenous contrast.  CONTRAST:  29mL OMNIPAQUE IOHEXOL 300 MG/ML  SOLN  COMPARISON:  Ultrasound the abdomen 08/04/2009  FINDINGS: Lower chest: No pulmonary nodules, pleural effusions, or infiltrates. Coronary artery calcifications are present. Hiatal hernia is noted.  Upper abdomen: No focal abnormality identified within the liver. The gallbladder is absent. Small low-attenuation lesions are identified within the spleen which are too small to be characterized. No focal abnormality identified within the pancreas or adrenal glands. Small cyst is identified in the lower pole the right kidney. No hydronephrosis.  Gastrointestinal tract: Hiatal hernia.  Otherwise the stomach has a normal appearance. Small bowel loops are normal in appearance. Status post appendectomy. Numerous colonic diverticula are present. No associated inflammation or obstruction.  Pelvis: Status post hysterectomy. A 1.1 cm benign-appearing right adnexal mass is identified, likely ovarian cyst. No free pelvic fluid.  Retroperitoneum: There is dense atherosclerotic calcification of the tortuous abdominal aorta. No aneurysm. No retroperitoneal or mesenteric adenopathy.  Abdominal wall: Unremarkable.  Osseous structures: Degenerative changes are seen in the lower lumbar spine, particularly at L3-4 where there is disc protrusion. There is disc protrusion, and disc height loss, and vacuum disc phenomenon at L4-5. No suspicious lytic or blastic lesions are identified.  IMPRESSION: 1. Diverticulosis without acute diverticulitis. No colonic mass identified to account for the patient's symptoms. 2. Coronary artery disease. 3. Small splenic lesions are too small to characterize but favored to be benign. 4. Small cysts within the lower pole the right kidney. 5. Hiatal hernia. 6. Status post appendectomy. 7. Right adnexal cyst 1.1 cm. Given the benign appearance and size/menopausal status no further evaluation is necessary based on consensus criteria. 8. Degenerative disc disease. 9. Atherosclerotic calcification of the abdominal aorta.   Electronically Signed   By: Nolon Nations M.D.   On: 04/13/2015 18:42    ____________________________________________   PROCEDURES  Procedure(s) performed: None  Critical Care performed: No ____________________________________________   INITIAL IMPRESSION / ASSESSMENT AND PLAN / ED COURSE  Pertinent labs & imaging results that were available during my care of the patient were reviewed by me and considered in my medical decision making (see chart for details).  81 y.o. female status post recent colonic polyp biopsy, on antibiotics for diverticulitis,  presenting with melena and lightheadedness. The patient is hemodynamically stable, but her labs from triage due show an anemia with  a hematocrit of 30. She has a history of anemia we do not have any previous labs from her. She is not anticoagulated. At this time there is no evidence of GI bleed as her rectal exam does not show blood. She does not have any fever or tenderness in the abdomen, so acute surgical pathology including colonic perforation is unlikely. Consider bleeding as a complication of diverticulitis. Will plan CT of the abdomen, IV fluids for now. The patient has a type and screen and if she has any further episodes of bleeding we will plan transfusion but given that she is hemodynamically stable at this time we will hold off on transfusion.  ----------------------------------------- 6:53 PM on 04/13/2015 -----------------------------------------  The patient has been hemodynamically stable while she has been here. She has had another large bowel movement that had no gross signs of blood. Her CT scan does not show any acute causes for her bleeding. Plan to admit to the hospitalist for GI bleed with symptomatic anemia. ____________________________________________  FINAL CLINICAL IMPRESSION(S) / ED DIAGNOSES  Final diagnoses:  Melena  Symptomatic anemia      NEW MEDICATIONS STARTED DURING THIS VISIT:  New Prescriptions   No medications on file     Eula Listen, MD 04/13/15 [phone]

## 2015-04-13 NOTE — ED Notes (Signed)
Lab needs new blue top and new order for pt/inr

## 2015-04-13 NOTE — ED Notes (Signed)
Patient transported to CT 

## 2015-04-13 NOTE — H&P (Signed)
[place] [facility] NAME: Sharon Dickson    MR#:  [id number]  DATE OF BIRTH:  05/30/1934  DATE OF ADMISSION:  04/13/2015  PRIMARY CARE PHYSICIAN: Ezequiel Kayser, MD   REQUESTING/REFERRING PHYSICIAN: Eula Listen, MD  CHIEF COMPLAINT:   Chief Complaint  Patient presents with  . Rectal Bleeding   melena 8 days since colonoscopy  HISTORY OF PRESENT ILLNESS:  Sharon Dickson  is a 81 y.o. female with a known history of hypertension, diabetes, CKD and diverticulosis.the patient got a colonoscopy with polyp biopsy 8 days ago. She was started Cipro and Flagyl for diverticulosis. She presents in the ED with the melena for the past 8 days after colonoscopy. She also complains of intermittent mild right lower quadrant pain but not today. She has had another large bowel movement that had no gross signs of blood. The patient has generalized weakness and dizziness but denies any chest pain, palpitation, nausea or vomiting. She is not on any aspirin or blood thinner.  PAST MEDICAL HISTORY:   Past Medical History  Diagnosis Date  . Cancer     skin ca  . Chronic kidney disease   . GERD (gastroesophageal reflux disease)   . Bipolar disorder   . Anxiety   . Hypertension   . Diabetes mellitus without complication   . Hyperlipidemia   . Proteinuria   . Allergic rhinitis   . Benign essential tremor   . Anemia   . Vitamin B 12 deficiency   . Cognitive impairment   . UTI (urinary tract infection)   . SVT (supraventricular tachycardia)   . Cystocele   . Cystic disease of breast   . Diverticulosis     PAST SURGICAL HISTORY:   Past Surgical History  Procedure Laterality Date  . Appendectomy    . Abdominal hysterectomy    . Cholecystectomy    . Bladder surgery  10/2008  . Colonoscopy  11/09/2014  . Colonoscopy with propofol N/A 04/05/2015    Procedure: COLONOSCOPY WITH PROPOFOL;  Surgeon: Lollie Sails, MD;  Location: [facility] ENDOSCOPY;   Service: Endoscopy;  Laterality: N/A;    SOCIAL HISTORY:   Social History  Substance Use Topics  . Smoking status: Never Smoker   . Smokeless tobacco: Never Used  . Alcohol Use: No    FAMILY HISTORY:   Family History  Problem Relation Age of Onset  . Breast cancer Daughter 64    DRUG ALLERGIES:   Allergies  Allergen Reactions  . Ceftin [Cefuroxime Axetil] Nausea And Vomiting  . Codeine Nausea And Vomiting  . Paxil [Paroxetine Hcl] Other (See Comments)    Reaction:  Unknown   . Prozac [Fluoxetine Hcl] Other (See Comments)    Reaction:  Dizziness     REVIEW OF SYSTEMS:  CONSTITUTIONAL: No fever, generalized  weakness.  EYES: No blurred or double vision.  EARS, NOSE, AND THROAT: No tinnitus or ear pain.  RESPIRATORY: No cough, shortness of breath, wheezing or hemoptysis.  CARDIOVASCULAR: No chest pain, orthopnea, edema.  GASTROINTESTINAL: No nausea, vomiting, diarrhea or abdominal pain. But has melena.  GENITOURINARY: No dysuria, hematuria.  ENDOCRINE: No polyuria, nocturia,  HEMATOLOGY: No anemia, easy bruising or bleeding SKIN: No rash or lesion. MUSCULOSKELETAL: No joint pain or arthritis.   NEUROLOGIC: No tingling, numbness, weakness.  PSYCHIATRY: No anxiety or depression.   MEDICATIONS AT HOME:   Prior to Admission medications   Medication Sig Start Date End Date Taking? Authorizing Provider  Biotin  1 MG CAPS Take 1 mg by mouth daily.    Yes Historical Provider, MD  cholecalciferol (VITAMIN D) 1000 UNITS tablet Take 1,000 Units by mouth daily.   Yes Historical Provider, MD  ciprofloxacin (CIPRO) 500 MG tablet Take 500 mg by mouth 2 (two) times daily. 04/05/15 04/15/15 Yes Historical Provider, MD  cyanocobalamin (,VITAMIN B-12,) 1000 MCG/ML injection Inject 1,000 mcg into the muscle every 30 (thirty) days. Pt uses on the 3rd of every month.   Yes Historical Provider, MD  donepezil (ARICEPT) 10 MG tablet Take 10 mg by mouth at bedtime.   Yes Historical Provider, MD   fluticasone (FLONASE) 50 MCG/ACT nasal spray Place 2 sprays into both nostrils daily as needed for rhinitis.    Yes Historical Provider, MD  insulin glargine (LANTUS) 100 UNIT/ML injection Inject 14 Units into the skin at bedtime.   Yes Historical Provider, MD  losartan (COZAAR) 50 MG tablet Take 50 mg by mouth daily.   Yes Historical Provider, MD  metFORMIN (GLUCOPHAGE-XR) 500 MG 24 hr tablet Take 500-1,000 mg by mouth 2 (two) times daily. Pt takes two in the morning and one in the evening.   Yes Historical Provider, MD  omeprazole (PRILOSEC) 20 MG capsule Take 20 mg by mouth at bedtime.    Yes Historical Provider, MD  primidone (MYSOLINE) 50 MG tablet Take 50 mg by mouth 2 (two) times daily.    Yes Historical Provider, MD  propranolol (INDERAL) 40 MG tablet Take 40 mg by mouth 2 (two) times daily.   Yes Historical Provider, MD  venlafaxine XR (EFFEXOR-XR) 75 MG 24 hr capsule Take 75-150 mg by mouth 2 (two) times daily. Pt takes two in the morning and one at night.   Yes Historical Provider, MD  vitamin E 400 UNIT capsule Take 400 Units by mouth daily.   Yes Historical Provider, MD      VITAL SIGNS:  Blood pressure 142/65, pulse 58, temperature 98.4 F (36.9 C), temperature source Oral, resp. rate 18, height 5\' 4"  (1.626 m), weight 57.153 kg (126 lb), SpO2 96 %.  PHYSICAL EXAMINATION:  GENERAL:  81 y.o.-year-old patient lying in the bed with no acute distress.  EYES: Pupils equal, round, reactive to light and accommodation. No scleral icterus. Extraocular muscles intact.  HEENT: Head atraumatic, normocephalic. Oropharynx and nasopharynx clear.  NECK:  Supple, no jugular venous distention. No thyroid enlargement, no tenderness.  LUNGS: Normal breath sounds bilaterally, no wheezing, rales,rhonchi or crepitation. No use of accessory muscles of respiration.  CARDIOVASCULAR: S1, S2 normal. No murmurs, rubs, or gallops.  ABDOMEN: Soft, nontender, nondistended. Bowel sounds present. No  organomegaly or mass.  EXTREMITIES: No pedal edema, cyanosis, or clubbing.  NEUROLOGIC: Cranial nerves II through XII are intact. Muscle strength 5/5 in all extremities. Sensation intact. Gait not checked.  PSYCHIATRIC: The patient is alert and oriented x 3.  SKIN: No obvious rash, lesion, or ulcer.   LABORATORY PANEL:   CBC  Recent Labs Lab 04/13/15 1353  WBC 6.1  HGB 10.1*  HCT 30.0*  PLT 306   ------------------------------------------------------------------------------------------------------------------  Chemistries   Recent Labs Lab 04/13/15 1353  NA 137  K 4.0  CL 109  CO2 23  GLUCOSE 191*  BUN 17  CREATININE 1.04*  CALCIUM 8.5*  AST 28  ALT 21  ALKPHOS 59  BILITOT 0.3   ------------------------------------------------------------------------------------------------------------------  Cardiac Enzymes  Recent Labs Lab 04/13/15 1605  TROPONINI <0.03   ------------------------------------------------------------------------------------------------------------------  RADIOLOGY:  Ct Abdomen Pelvis W Contrast  04/13/2015  CLINICAL DATA:  Patient reports having colonoscopy 8 days ago. Noticed today having very dark stool.  EXAM: CT ABDOMEN AND PELVIS WITH CONTRAST  TECHNIQUE: Multidetector CT imaging of the abdomen and pelvis was performed using the standard protocol following bolus administration of intravenous contrast.  CONTRAST:  71mL OMNIPAQUE IOHEXOL 300 MG/ML  SOLN  COMPARISON:  Ultrasound the abdomen 08/04/2009  FINDINGS: Lower chest: No pulmonary nodules, pleural effusions, or infiltrates. Coronary artery calcifications are present. Hiatal hernia is noted.  Upper abdomen: No focal abnormality identified within the liver. The gallbladder is absent. Small low-attenuation lesions are identified within the spleen which are too small to be characterized. No focal abnormality identified within the pancreas or adrenal glands. Small cyst is identified in the  lower pole the right kidney. No hydronephrosis.  Gastrointestinal tract: Hiatal hernia. Otherwise the stomach has a normal appearance. Small bowel loops are normal in appearance. Status post appendectomy. Numerous colonic diverticula are present. No associated inflammation or obstruction.  Pelvis: Status post hysterectomy. A 1.1 cm benign-appearing right adnexal mass is identified, likely ovarian cyst. No free pelvic fluid.  Retroperitoneum: There is dense atherosclerotic calcification of the tortuous abdominal aorta. No aneurysm. No retroperitoneal or mesenteric adenopathy.  Abdominal wall: Unremarkable.  Osseous structures: Degenerative changes are seen in the lower lumbar spine, particularly at L3-4 where there is disc protrusion. There is disc protrusion, and disc height loss, and vacuum disc phenomenon at L4-5. No suspicious lytic or blastic lesions are identified.  IMPRESSION: 1. Diverticulosis without acute diverticulitis. No colonic mass identified to account for the patient's symptoms. 2. Coronary artery disease. 3. Small splenic lesions are too small to characterize but favored to be benign. 4. Small cysts within the lower pole the right kidney. 5. Hiatal hernia. 6. Status post appendectomy. 7. Right adnexal cyst 1.1 cm. Given the benign appearance and size/menopausal status no further evaluation is necessary based on consensus criteria. 8. Degenerative disc disease. 9. Atherosclerotic calcification of the abdominal aorta.   Electronically Signed   By: Nolon Nations M.D.   On: 04/13/2015 18:42    EKG:   Orders placed or performed during the hospital encounter of 04/13/15  . ED EKG  . ED EKG    IMPRESSION AND PLAN:   GI bleeding Anemia Diverticulosis Hypertension Diabetes  The patient will be placed for observation.  I will keep patient nothing by mouth except for simple water and medication, give IV fluid support, start Protonix IV twice a day and get a GI consult from Dr. Jonathon Resides.  Follow-up CBC in the morning. Continue hypertension medication. Start Sliding scale and hold Lantus Lantus due to low blood sugar at 59 just now..  All the records are reviewed and case discussed with ED provider. Management plans discussed with the patient, family and they are in agreement.  CODE STATUS: Full code  TOTAL TIME TAKING CARE OF THIS PATIENT: 50 minutes.    Demetrios Loll M.D on 04/13/2015 at 7:28 PM  Between 7am to 6pm - Pager - [phone]  After 6pm go to [email or web address] - password [email or web address] [place] [facility]  Office  [phone]  CC: Primary care physician; Ezequiel Kayser, MD

## 2015-04-14 DIAGNOSIS — K219 Gastro-esophageal reflux disease without esophagitis: Secondary | ICD-10-CM | POA: Diagnosis not present

## 2015-04-14 DIAGNOSIS — K921 Melena: Secondary | ICD-10-CM | POA: Diagnosis not present

## 2015-04-14 LAB — BASIC METABOLIC PANEL
ANION GAP: 8 (ref 5–15)
BUN: 14 mg/dL (ref 6–20)
CHLORIDE: 113 mmol/L — AB (ref 101–111)
CO2: 19 mmol/L — ABNORMAL LOW (ref 22–32)
Calcium: 8.1 mg/dL — ABNORMAL LOW (ref 8.9–10.3)
Creatinine, Ser: 0.81 mg/dL (ref 0.44–1.00)
GFR calc Af Amer: >60 mL/min (ref >60)
GLUCOSE: 129 mg/dL — AB (ref 65–99)
POTASSIUM: 4.1 mmol/L (ref 3.5–5.1)
Sodium: 140 mmol/L (ref 135–145)

## 2015-04-14 LAB — CBC
HEMATOCRIT: 24.3 % — AB (ref 35.0–47.0)
HEMOGLOBIN: 8.1 g/dL — AB (ref 12.0–16.0)
MCH: 29.2 pg (ref 26.0–34.0)
MCHC: 33.2 g/dL (ref 32.0–36.0)
MCV: 88 fL (ref 80.0–100.0)
Platelets: 229 10*3/uL (ref 150–440)
RBC: 2.76 MIL/uL — ABNORMAL LOW (ref 3.80–5.20)
RDW: 14.5 % (ref 11.5–14.5)
WBC: 4.2 10*3/uL (ref 3.6–11.0)

## 2015-04-14 LAB — GLUCOSE, CAPILLARY
GLUCOSE-CAPILLARY: 59 mg/dL — AB (ref 65–99)
GLUCOSE-CAPILLARY: 123 mg/dL — AB (ref 65–99)
GLUCOSE-CAPILLARY: 192 mg/dL — AB (ref 65–99)
Glucose-Capillary: 68 mg/dL (ref 65–99)
Glucose-Capillary: 116 mg/dL — ABNORMAL HIGH (ref 65–99)
Glucose-Capillary: 89 mg/dL (ref 65–99)

## 2015-04-14 NOTE — Progress Notes (Signed)
[place] at [facility] [place]    MR#:  [id number]  DATE OF BIRTH:  1933-11-09  SUBJECTIVE:  CHIEF COMPLAINT:   Chief Complaint  Patient presents with  . Rectal Bleeding    had a mass in ceacum- and colonoscopy attempted last week-but polyp not removed, she had diverticulitis- and was treated with Abx, now have black stool. Feels weak and dizzi. Hungry- asking for food.  REVIEW OF SYSTEMS:  CONSTITUTIONAL: No fever,positive for fatigue or weakness.  EYES: No blurred or double vision.  EARS, NOSE, AND THROAT: No tinnitus or ear pain.  RESPIRATORY: No cough, shortness of breath, wheezing or hemoptysis.  CARDIOVASCULAR: No chest pain, orthopnea, edema.  GASTROINTESTINAL: No nausea, vomiting, diarrhea or abdominal pain.  GENITOURINARY: No dysuria, hematuria.  ENDOCRINE: No polyuria, nocturia,  HEMATOLOGY: No anemia, easy bruising or bleeding SKIN: No rash or lesion. MUSCULOSKELETAL: No joint pain or arthritis.   NEUROLOGIC: No tingling, numbness, weakness.  PSYCHIATRY: No anxiety or depression.   ROS  DRUG ALLERGIES:   Allergies  Allergen Reactions  . Ceftin [Cefuroxime Axetil] Nausea And Vomiting  . Codeine Nausea And Vomiting  . Paxil [Paroxetine Hcl] Other (See Comments)    Reaction:  Unknown   . Prozac [Fluoxetine Hcl] Other (See Comments)    Reaction:  Dizziness     VITALS:  Blood pressure 159/54, pulse 67, temperature 98.4 F (36.9 C), temperature source Oral, resp. rate 18, height 5\' 4"  (1.626 m), weight 58.378 kg (128 lb 11.2 oz), SpO2 99 %.  PHYSICAL EXAMINATION:  GENERAL:  81 y.o.-year-old patient lying in the bed with no acute distress.  EYES: Pupils equal, round, reactive to light and accommodation. No scleral icterus. Extraocular muscles intact. Conjunctiva pale. HEENT: Head atraumatic, normocephalic. Oropharynx and nasopharynx clear.  NECK:  Supple, no jugular venous distention. No thyroid  enlargement, no tenderness.  LUNGS: Normal breath sounds bilaterally, no wheezing, rales,rhonchi or crepitation. No use of accessory muscles of respiration.  CARDIOVASCULAR: S1, S2 normal. No murmurs, rubs, or gallops.  ABDOMEN: Soft, nontender, nondistended. Bowel sounds present. No organomegaly or mass.  EXTREMITIES: No pedal edema, cyanosis, or clubbing.  NEUROLOGIC: Cranial nerves II through XII are intact. Muscle strength 5/5 in all extremities. Sensation intact. Gait not checked.  PSYCHIATRIC: The patient is alert and oriented x 3.  SKIN: No obvious rash, lesion, or ulcer.   Physical Exam LABORATORY PANEL:   CBC  Recent Labs Lab 04/14/15 0641  WBC 4.2  HGB 8.1*  HCT 24.3*  PLT 229   ------------------------------------------------------------------------------------------------------------------  Chemistries   Recent Labs Lab 04/13/15 1353 04/14/15 0641  NA 137 140  K 4.0 4.1  CL 109 113*  CO2 23 19*  GLUCOSE 191* 129*  BUN 17 14  CREATININE 1.04* 0.81  CALCIUM 8.5* 8.1*  AST 28  --   ALT 21  --   ALKPHOS 59  --   BILITOT 0.3  --    ------------------------------------------------------------------------------------------------------------------  Cardiac Enzymes  Recent Labs Lab 04/13/15 1605  TROPONINI <0.03   ------------------------------------------------------------------------------------------------------------------  RADIOLOGY:  Ct Abdomen Pelvis W Contrast  04/13/2015   CLINICAL DATA:  Patient reports having colonoscopy 8 days ago. Noticed today having very dark stool.  EXAM: CT ABDOMEN AND PELVIS WITH CONTRAST  TECHNIQUE: Multidetector CT imaging of the abdomen and pelvis was performed using the standard protocol following bolus administration of intravenous contrast.  CONTRAST:  22mL OMNIPAQUE IOHEXOL 300 MG/ML  SOLN  COMPARISON:  Ultrasound the  abdomen 08/04/2009  FINDINGS: Lower chest: No pulmonary nodules, pleural effusions, or infiltrates.  Coronary artery calcifications are present. Hiatal hernia is noted.  Upper abdomen: No focal abnormality identified within the liver. The gallbladder is absent. Small low-attenuation lesions are identified within the spleen which are too small to be characterized. No focal abnormality identified within the pancreas or adrenal glands. Small cyst is identified in the lower pole the right kidney. No hydronephrosis.  Gastrointestinal tract: Hiatal hernia. Otherwise the stomach has a normal appearance. Small bowel loops are normal in appearance. Status post appendectomy. Numerous colonic diverticula are present. No associated inflammation or obstruction.  Pelvis: Status post hysterectomy. A 1.1 cm benign-appearing right adnexal mass is identified, likely ovarian cyst. No free pelvic fluid.  Retroperitoneum: There is dense atherosclerotic calcification of the tortuous abdominal aorta. No aneurysm. No retroperitoneal or mesenteric adenopathy.  Abdominal wall: Unremarkable.  Osseous structures: Degenerative changes are seen in the lower lumbar spine, particularly at L3-4 where there is disc protrusion. There is disc protrusion, and disc height loss, and vacuum disc phenomenon at L4-5. No suspicious lytic or blastic lesions are identified.  IMPRESSION: 1. Diverticulosis without acute diverticulitis. No colonic mass identified to account for the patient's symptoms. 2. Coronary artery disease. 3. Small splenic lesions are too small to characterize but favored to be benign. 4. Small cysts within the lower pole the right kidney. 5. Hiatal hernia. 6. Status post appendectomy. 7. Right adnexal cyst 1.1 cm. Given the benign appearance and size/menopausal status no further evaluation is necessary based on consensus criteria. 8. Degenerative disc disease. 9. Atherosclerotic calcification of the abdominal aorta.   Electronically Signed   By: Nolon Nations M.D.   On: 04/13/2015 18:42    ASSESSMENT AND PLAN:   * GI bleeding-     Hb dropped from 10-8 , no more BM.   Had recent colonoscopy, Called Gi consult.   Currently resume on liquid diet. IV fluids.  * Anemia   Hb 8, if drops further- may transfuse.  * Diverticulosis- just received treatment for diverticulitis.  * Hypertension- losartan  * Diabetes- insulin sliding scale coverage- as will be only on liquids.   All the records are reviewed and case discussed with Care Management/Social Workerr. Management plans discussed with the patient, family and they are in agreement.  CODE STATUS: full  TOTAL TIME TAKING CARE OF THIS PATIENT: 35 minutes.    POSSIBLE D/C IN 1-2 DAYS, DEPENDING ON CLINICAL CONDITION.   Vaughan Basta M.D on 04/14/2015   Between 7am to 6pm - Pager - [phone]  After 6pm go to [email or web address] - password [email or web address] [place] [facility]  Office  [phone]  CC: Primary care physician; Ezequiel Kayser, MD  Note: This dictation was prepared with Dragon dictation along with smaller phrase technology. Any transcriptional errors that result from this process are unintentional.

## 2015-04-14 NOTE — Plan of Care (Signed)
Problem: Discharge Progression Outcomes Goal: Other Discharge Outcomes/Goals Outcome: Progressing Plan of care progress: -continues IV fluids -GI consulting -states that she did have a small black stool last night -monitor labs and HGB -no complaints of pain, no distress or discomfort

## 2015-04-14 NOTE — Consult Note (Signed)
[facility]  [address] [place], [place] [address] Phone: [phone] Fax : [phone]  Consultation  Referring Provider:     No ref. provider found Primary Care Physician:  Ezequiel Kayser, MD Primary Gastroenterologist:  Dr. Gustavo Lah         Reason for Consultation:     Melena  Date of Admission:  04/13/2015 Date of Consultation:  04/14/2015         HPI:   Sharon Dickson is a 81 y.o. female who comes in after having a colonoscopy 9 days ago for melena. The patient states that she had some abdominal cramping and then had some black stools. The patient was found a Corliss months ago to have a cecal lesion that she was sent to surgery for evaluation. The patient states that surgery had recommended having a resection. The patient then was told that her primary gastrologist to try to take the lesion now with another colonoscopy. At that time the patient was told she had diverticulitis and the polyp could not be taken out and she was referred back to the surgeon. Patient was doing well until she started having black stools just prior to admission. She was admitted with a hemoglobin of 10 which came down to 8.1 this morning. The patient states she is not having any abdominal pain and her first bowel movement was approximate 1 hour ago but nothing else throughout the entire day. She also reports that she is hungry.  Past Medical History  Diagnosis Date  . Cancer     skin ca  . Chronic kidney disease   . GERD (gastroesophageal reflux disease)   . Bipolar disorder   . Anxiety   . Hypertension   . Diabetes mellitus without complication   . Hyperlipidemia   . Proteinuria   . Allergic rhinitis   . Benign essential tremor   . Anemia   . Vitamin B 12 deficiency   . Cognitive impairment   . UTI (urinary tract infection)   . SVT (supraventricular tachycardia)   . Cystocele   . Cystic disease of breast   . Diverticulosis     Past Surgical History  Procedure  Laterality Date  . Appendectomy    . Abdominal hysterectomy    . Cholecystectomy    . Bladder surgery  10/2008  . Colonoscopy  11/09/2014  . Colonoscopy with propofol N/A 04/05/2015    Procedure: COLONOSCOPY WITH PROPOFOL;  Surgeon: Lollie Sails, MD;  Location: [facility] ENDOSCOPY;  Service: Endoscopy;  Laterality: N/A;    Prior to Admission medications   Medication Sig Start Date End Date Taking? Authorizing Provider  Biotin 1 MG CAPS Take 1 mg by mouth daily.    Yes Historical Provider, MD  cholecalciferol (VITAMIN D) 1000 UNITS tablet Take 1,000 Units by mouth daily.   Yes Historical Provider, MD  ciprofloxacin (CIPRO) 500 MG tablet Take 500 mg by mouth 2 (two) times daily. 04/05/15 04/15/15 Yes Historical Provider, MD  cyanocobalamin (,VITAMIN B-12,) 1000 MCG/ML injection Inject 1,000 mcg into the muscle every 30 (thirty) days. Pt uses on the 3rd of every month.   Yes Historical Provider, MD  donepezil (ARICEPT) 10 MG tablet Take 10 mg by mouth at bedtime.   Yes Historical Provider, MD  fluticasone (FLONASE) 50 MCG/ACT nasal spray Place 2 sprays into both nostrils daily as needed for rhinitis.    Yes Historical Provider, MD  insulin glargine (LANTUS) 100 UNIT/ML injection Inject 14 Units into the skin at bedtime.  Yes Historical Provider, MD  losartan (COZAAR) 50 MG tablet Take 50 mg by mouth daily.   Yes Historical Provider, MD  metFORMIN (GLUCOPHAGE-XR) 500 MG 24 hr tablet Take 500-1,000 mg by mouth 2 (two) times daily. Pt takes two in the morning and one in the evening.   Yes Historical Provider, MD  omeprazole (PRILOSEC) 20 MG capsule Take 20 mg by mouth at bedtime.    Yes Historical Provider, MD  primidone (MYSOLINE) 50 MG tablet Take 50 mg by mouth 2 (two) times daily.    Yes Historical Provider, MD  propranolol (INDERAL) 40 MG tablet Take 40 mg by mouth 2 (two) times daily.   Yes Historical Provider, MD  venlafaxine XR (EFFEXOR-XR) 75 MG 24 hr capsule Take 75-150 mg by mouth 2 (two)  times daily. Pt takes two in the morning and one at night.   Yes Historical Provider, MD  vitamin E 400 UNIT capsule Take 400 Units by mouth daily.   Yes Historical Provider, MD    Family History  Problem Relation Age of Onset  . Breast cancer Daughter 81     Social History  Substance Use Topics  . Smoking status: Never Smoker   . Smokeless tobacco: Never Used  . Alcohol Use: No    Allergies as of 04/13/2015 - Review Complete 04/13/2015  Allergen Reaction Noted  . Ceftin [cefuroxime axetil] Nausea And Vomiting 04/04/2015  . Codeine Nausea And Vomiting 04/04/2015  . Paxil [paroxetine hcl] Other (See Comments) 04/04/2015  . Prozac [fluoxetine hcl] Other (See Comments) 04/04/2015    Review of Systems:    All systems reviewed and negative except where noted in HPI.   Physical Exam:  Vital signs in last 24 hours: Temp:  [97.7 F (36.5 C)-98.9 F (37.2 C)] 98.4 F (36.9 C) (09/22 1257) Pulse Rate:  [60-69] 67 (09/22 1257) Resp:  [16-18] 18 (09/22 0830) BP: (133-178)/(54-71) 159/54 mmHg (09/22 1257) SpO2:  [97 %-100 %] 99 % (09/22 1257) Weight:  [128 lb 11.2 oz (58.378 kg)] 128 lb 11.2 oz (58.378 kg) (09/21 2032) Last BM Date: 04/13/15 General:   Pleasant, cooperative in NAD Head:  Normocephalic and atraumatic. Eyes:   No icterus.   Conjunctiva pink. PERRLA. Ears:  Normal auditory acuity. Neck:  Supple; no masses or thyroidomegaly Lungs: Respirations even and unlabored. Lungs clear to auscultation bilaterally.   No wheezes, crackles, or rhonchi.  Heart:  Regular rate and rhythm;  Without murmur, clicks, rubs or gallops Abdomen:  Soft, nondistended, nontender. Normal bowel sounds. No appreciable masses or hepatomegaly.  No rebound or guarding.  Rectal:  Not performed. Msk:  Symmetrical without gross deformities.  Strength  Extremities:  Without edema, cyanosis or clubbing. Neurologic:  Alert and oriented x3;  grossly normal neurologically. Skin:  Intact without significant  lesions or rashes. Cervical Nodes:  No significant cervical adenopathy. Psych:  Alert and cooperative. Normal affect.  LAB RESULTS:  Recent Labs  04/13/15 1353 04/14/15 0641  WBC 6.1 4.2  HGB 10.1* 8.1*  HCT 30.0* 24.3*  PLT 306 229   BMET  Recent Labs  04/13/15 1353 04/14/15 0641  NA 137 140  K 4.0 4.1  CL 109 113*  CO2 23 19*  GLUCOSE 191* 129*  BUN 17 14  CREATININE 1.04* 0.81  CALCIUM 8.5* 8.1*   LFT  Recent Labs  04/13/15 1353  PROT 6.5  ALBUMIN 3.3*  AST 28  ALT 21  ALKPHOS 59  BILITOT 0.3   PT/INR No results for input(s):  LABPROT, INR in the last 72 hours.  STUDIES: Ct Abdomen Pelvis W Contrast  04/13/2015   CLINICAL DATA:  Patient reports having colonoscopy 8 days ago. Noticed today having very dark stool.  EXAM: CT ABDOMEN AND PELVIS WITH CONTRAST  TECHNIQUE: Multidetector CT imaging of the abdomen and pelvis was performed using the standard protocol following bolus administration of intravenous contrast.  CONTRAST:  61mL OMNIPAQUE IOHEXOL 300 MG/ML  SOLN  COMPARISON:  Ultrasound the abdomen 08/04/2009  FINDINGS: Lower chest: No pulmonary nodules, pleural effusions, or infiltrates. Coronary artery calcifications are present. Hiatal hernia is noted.  Upper abdomen: No focal abnormality identified within the liver. The gallbladder is absent. Small low-attenuation lesions are identified within the spleen which are too small to be characterized. No focal abnormality identified within the pancreas or adrenal glands. Small cyst is identified in the lower pole the right kidney. No hydronephrosis.  Gastrointestinal tract: Hiatal hernia. Otherwise the stomach has a normal appearance. Small bowel loops are normal in appearance. Status post appendectomy. Numerous colonic diverticula are present. No associated inflammation or obstruction.  Pelvis: Status post hysterectomy. A 1.1 cm benign-appearing right adnexal mass is identified, likely ovarian cyst. No free pelvic  fluid.  Retroperitoneum: There is dense atherosclerotic calcification of the tortuous abdominal aorta. No aneurysm. No retroperitoneal or mesenteric adenopathy.  Abdominal wall: Unremarkable.  Osseous structures: Degenerative changes are seen in the lower lumbar spine, particularly at L3-4 where there is disc protrusion. There is disc protrusion, and disc height loss, and vacuum disc phenomenon at L4-5. No suspicious lytic or blastic lesions are identified.  IMPRESSION: 1. Diverticulosis without acute diverticulitis. No colonic mass identified to account for the patient's symptoms. 2. Coronary artery disease. 3. Small splenic lesions are too small to characterize but favored to be benign. 4. Small cysts within the lower pole the right kidney. 5. Hiatal hernia. 6. Status post appendectomy. 7. Right adnexal cyst 1.1 cm. Given the benign appearance and size/menopausal status no further evaluation is necessary based on consensus criteria. 8. Degenerative disc disease. 9. Atherosclerotic calcification of the abdominal aorta.   Electronically Signed   By: Nolon Nations M.D.   On: 04/13/2015 18:42      Impression / Plan:   Ardie Dragoo Dolph is a 81 y.o. y/o female with who comes in with melena 9 days after having a colonoscopy. The patient is unlikely to have been bleeding from her biopsy site in the cecum with her report of melena. The patient was put on antibiotics and this may have caused her to have the stomach with upper GI bleed causing her melanotic stools. The patient will be treated with a PPI and she states that she has not had any melena but did have 1 dark stool all day today. She is likely clearing out old blood. If the patient's hemoglobin is stable tomorrow the patient should be treated with a PPI and her diet advanced.   Thank you for involving me in the care of this patient.        Ollen Bowl, MD  04/14/2015, 4:16 PM   Note: This dictation was prepared with Dragon dictation along with  smaller phrase technology. Any transcriptional errors that result from this process are unintentional.

## 2015-04-14 NOTE — Care Management (Signed)
Admitted to [facility] with the diagnosis of GI Bleed. Lives with husband, Mallie Mussel, [phone]). No home health. No skilled facility. Uses no aids for ambulation. Takes care of all basic activities of daily living herself. Husband drives and takes care of most instrumental activities of daily living. Seen Dr. Eddie Dibbles (Endocrinology) 04/07/15. Colonscopy completed 04/05/15. Last seen in Dr. Raechel Ache office in June 2016. Seen Dr. Manuella Ghazi (neurology) 3 weeks ago following a fall. Good appetite. Husband will transport. Shelbie Ammons RN MSN Care Management [phone]

## 2015-04-14 NOTE — Plan of Care (Signed)
Problem: Discharge Progression Outcomes Goal: Barriers To Progression Addressed/Resolved Individualization of care  Likes to be called [facility]. Lives at home. Has history of anxiety, hypertension, GERD, diabetes, and hyperlipidemia, controlled by medications.

## 2015-04-14 NOTE — Plan of Care (Signed)
Problem: Discharge Progression Outcomes Goal: Other Discharge Outcomes/Goals Plan of care progress to goal: - Continues IV fluids. - No complaints of pain. - Ambulates with assist. Will continue monitor.

## 2015-04-15 DIAGNOSIS — K219 Gastro-esophageal reflux disease without esophagitis: Secondary | ICD-10-CM | POA: Diagnosis not present

## 2015-04-15 LAB — CBC
HCT: 28 % — ABNORMAL LOW (ref 35.0–47.0)
Hemoglobin: 9.5 g/dL — ABNORMAL LOW (ref 12.0–16.0)
MCH: 30.4 pg (ref 26.0–34.0)
MCHC: 33.8 g/dL (ref 32.0–36.0)
MCV: 89.9 fL (ref 80.0–100.0)
PLATELETS: 278 10*3/uL (ref 150–440)
RBC: 3.11 MIL/uL — AB (ref 3.80–5.20)
RDW: 14.5 % (ref 11.5–14.5)
WBC: 4.4 10*3/uL (ref 3.6–11.0)

## 2015-04-15 LAB — GLUCOSE, CAPILLARY
GLUCOSE-CAPILLARY: 177 mg/dL — AB (ref 65–99)
Glucose-Capillary: 152 mg/dL — ABNORMAL HIGH (ref 65–99)

## 2015-04-15 MED ORDER — FERROUS SULFATE 325 (65 FE) MG PO TABS: 325.0000 mg | ORAL_TABLET | Freq: Two times a day (BID) | ORAL | Status: DC

## 2015-04-15 MED ORDER — PANTOPRAZOLE SODIUM 40 MG PO TBEC
40.0000 mg | DELAYED_RELEASE_TABLET | Freq: Two times a day (BID) | ORAL | Status: DC
  Administered 2015-04-15: 14:00:00 40 mg via ORAL
  Filled 2015-04-15: qty 1

## 2015-04-15 MED ORDER — PANTOPRAZOLE SODIUM 40 MG PO TBEC: 40.0000 mg | DELAYED_RELEASE_TABLET | Freq: Two times a day (BID) | ORAL | Status: DC

## 2015-04-15 NOTE — Discharge Instructions (Signed)
Bloody Stools °Bloody stools means there is blood in your poop (stool). It is a sign that there is a problem somewhere in the digestive system. It is important for your doctor to find the cause of your bleeding, so the problem can be treated.  °HOME CARE °· Only take medicine as told by your doctor. °· Eat foods with fiber (prunes, bran cereals). °· Drink enough fluids to keep your pee (urine) clear or pale yellow. °· Sit in warm water (sitz bath) for 10 to 15 minutes as told by your doctor. °· Know how to take your medicines (enemas, suppositories) if advised by your doctor. °· Watch for signs that you are getting better or getting worse. °GET HELP RIGHT AWAY IF:  °· You are not getting better. °· You start to get better but then get worse again. °· You have new problems. °· You have severe bleeding from the place where poop comes out (rectum) that does not stop. °· You throw up (vomit) blood. °· You feel weak or pass out (faint). °· You have a fever. °MAKE SURE YOU:  °· Understand these instructions. °· Will watch your condition. °· Will get help right away if you are not doing well or get worse. °Document Released: 06/27/2009 Document Revised: 10/01/2011 Document Reviewed: 11/24/2010 °ExitCare® Patient Information ©2015 [employer]. This information is not intended to replace advice given to you by your health care provider. Make sure you discuss any questions you have with your health care provider. ° °

## 2015-04-15 NOTE — Progress Notes (Signed)
Spoke with Dr Molli Hazard pt IV site no longer functioning, new order to leave IV out, change protonix to PO

## 2015-04-15 NOTE — Plan of Care (Signed)
Problem: Discharge Progression Outcomes Goal: Other Discharge Outcomes/Goals Plan of care progress to goal: - Continues IV fluids. - No complaints of pain. - Ambulates with assist. Will continue monitor.

## 2015-04-15 NOTE — Discharge Summary (Signed)
[place] [facility] [place] NAME: Aiyannah Fayad    MR#:  [id number]  DATE OF BIRTH:  1934/02/19  DATE OF ADMISSION:  04/13/2015 ADMITTING PHYSICIAN: Demetrios Loll, MD  DATE OF DISCHARGE: No discharge date for patient encounter.  PRIMARY CARE PHYSICIAN: THIES, DAVID, MD    ADMISSION DIAGNOSIS:  Melena [K92.1] Sinus bradycardia [R00.1] Symptomatic anemia [D64.9]  DISCHARGE DIAGNOSIS:  Principal Problem:   GIB (gastrointestinal bleeding) Active Problems:   Anemia   Melena   SECONDARY DIAGNOSIS:   Past Medical History  Diagnosis Date  . Cancer     skin ca  . Chronic kidney disease   . GERD (gastroesophageal reflux disease)   . Bipolar disorder   . Anxiety   . Hypertension   . Diabetes mellitus without complication   . Hyperlipidemia   . Proteinuria   . Allergic rhinitis   . Benign essential tremor   . Anemia   . Vitamin B 12 deficiency   . Cognitive impairment   . UTI (urinary tract infection)   . SVT (supraventricular tachycardia)   . Cystocele   . Cystic disease of breast   . Diverticulosis     HOSPITAL COURSE:   * GI bleeding-   Hb dropped from 10-8 - now came up >9, no more  Blood in BM.  Had recent colonoscopy, Called Gi consult.  As per Dr. wall she might be excreting the old blood, if her hemoglobin remains stable and she is able to tolerate the diet she could be discharged.  So next day we tried upgrading her diet to soft her hemoglobin was more than 9.  Discharge home with follow-up with Dr. Anastasio Champion in office.  * Anemia  Hemoglobin is stable, no need for transfusion.  * Diverticulosis- just received treatment for diverticulitis.  Because of ciprofloxacin orally sitting for 8-9 days she's she had some loose stool one episode in hospital, but no fever, no rise in white cell count, no abdominal pain. So ordered stool culture to make sure no infection and we'll discharge her home after stopping her  ciprofloxacin tablets.  * Hypertension- losartan  * Diabetes- insulin sliding scale coverage- as will be only on liquids.  DISCHARGE CONDITIONS:   Stable  CONSULTS OBTAINED:  Treatment Team:  Lucilla Lame, MD  DRUG ALLERGIES:   Allergies  Allergen Reactions  . Ceftin [Cefuroxime Axetil] Nausea And Vomiting  . Codeine Nausea And Vomiting  . Paxil [Paroxetine Hcl] Other (See Comments)    Reaction:  Unknown   . Prozac [Fluoxetine Hcl] Other (See Comments)    Reaction:  Dizziness     DISCHARGE MEDICATIONS:   Current Discharge Medication List    START taking these medications   Details  ferrous sulfate 325 (65 FE) MG tablet Take 1 tablet (325 mg total) by mouth 2 (two) times daily with a meal. Qty: 60 tablet, Refills: 3    pantoprazole (PROTONIX) 40 MG tablet Take 1 tablet (40 mg total) by mouth 2 (two) times daily. Qty: 60 tablet, Refills: 0      CONTINUE these medications which have NOT CHANGED   Details  Biotin 1 MG CAPS Take 1 mg by mouth daily.     cholecalciferol (VITAMIN D) 1000 UNITS tablet Take 1,000 Units by mouth daily.    cyanocobalamin (,VITAMIN B-12,) 1000 MCG/ML injection Inject 1,000 mcg into the muscle every 30 (thirty) days. Pt uses on the 3rd of every month.    donepezil (ARICEPT) 10  MG tablet Take 10 mg by mouth at bedtime.    fluticasone (FLONASE) 50 MCG/ACT nasal spray Place 2 sprays into both nostrils daily as needed for rhinitis.     insulin glargine (LANTUS) 100 UNIT/ML injection Inject 14 Units into the skin at bedtime.    losartan (COZAAR) 50 MG tablet Take 50 mg by mouth daily.    metFORMIN (GLUCOPHAGE-XR) 500 MG 24 hr tablet Take 500-1,000 mg by mouth 2 (two) times daily. Pt takes two in the morning and one in the evening.    omeprazole (PRILOSEC) 20 MG capsule Take 20 mg by mouth at bedtime.     primidone (MYSOLINE) 50 MG tablet Take 50 mg by mouth 2 (two) times daily.     propranolol (INDERAL) 40 MG tablet Take 40 mg by mouth 2  (two) times daily.    venlafaxine XR (EFFEXOR-XR) 75 MG 24 hr capsule Take 75-150 mg by mouth 2 (two) times daily. Pt takes two in the morning and one at night.    vitamin E 400 UNIT capsule Take 400 Units by mouth daily.      STOP taking these medications     ciprofloxacin (CIPRO) 500 MG tablet          DISCHARGE INSTRUCTIONS:    Follow with Dr. Anastasio Champion in 1-2 weeks  If you experience worsening of your admission symptoms, develop shortness of breath, life threatening emergency, suicidal or homicidal thoughts you must seek medical attention immediately by calling 911 or calling your MD immediately  if symptoms less severe.  You Must read complete instructions/literature along with all the possible adverse reactions/side effects for all the Medicines you take and that have been prescribed to you. Take any new Medicines after you have completely understood and accept all the possible adverse reactions/side effects.   Please note  You were cared for by a hospitalist during your hospital stay. If you have any questions about your discharge medications or the care you received while you were in the hospital after you are discharged, you can call the unit and asked to speak with the hospitalist on call if the hospitalist that took care of you is not available. Once you are discharged, your primary care physician will handle any further medical issues. Please note that NO REFILLS for any discharge medications will be authorized once you are discharged, as it is imperative that you return to your primary care physician (or establish a relationship with a primary care physician if you do not have one) for your aftercare needs so that they can reassess your need for medications and monitor your lab values.    Today   CHIEF COMPLAINT:   Chief Complaint  Patient presents with  . Rectal Bleeding    HISTORY OF PRESENT ILLNESS:  Esli Olheiser  is a 81 y.o. female with a known history of  hypertension, diabetes, CKD and diverticulosis.the patient got a colonoscopy with polyp biopsy 8 days ago. She was started Cipro and Flagyl for diverticulosis. She presents in the ED with the melena for the past 8 days after colonoscopy. She also complains of intermittent mild right lower quadrant pain but not today. She has had another large bowel movement that had no gross signs of blood. The patient has generalized weakness and dizziness but denies any chest pain, palpitation, nausea or vomiting. She is not on any aspirin or blood thinner.   VITAL SIGNS:  Blood pressure 175/61, pulse 64, temperature 97.9 F (36.6 C), temperature source Oral, resp.  rate 20, height 5\' 4"  (1.626 m), weight 58.378 kg (128 lb 11.2 oz), SpO2 100 %.  I/O:   Intake/Output Summary (Last 24 hours) at 04/15/15 1501 Last data filed at 04/15/15 0800  Gross per 24 hour  Intake   1277 ml  Output    301 ml  Net    976 ml    PHYSICAL EXAMINATION:   GENERAL: 81 y.o.-year-old patient lying in the bed with no acute distress.  EYES: Pupils equal, round, reactive to light and accommodation. No scleral icterus. Extraocular muscles intact. Conjunctiva pale. HEENT: Head atraumatic, normocephalic. Oropharynx and nasopharynx clear.  NECK: Supple, no jugular venous distention. No thyroid enlargement, no tenderness.  LUNGS: Normal breath sounds bilaterally, no wheezing, rales,rhonchi or crepitation. No use of accessory muscles of respiration.  CARDIOVASCULAR: S1, S2 normal. No murmurs, rubs, or gallops.  ABDOMEN: Soft, nontender, nondistended. Bowel sounds present. No organomegaly or mass.  EXTREMITIES: No pedal edema, cyanosis, or clubbing.  NEUROLOGIC: Cranial nerves II through XII are intact. Muscle strength 5/5 in all extremities. Sensation intact. Gait not checked.  PSYCHIATRIC: The patient is alert and oriented x 3.  SKIN: No obvious rash, lesion, or ulcer.   DATA REVIEW:   CBC  Recent Labs Lab  04/15/15 0444  WBC 4.4  HGB 9.5*  HCT 28.0*  PLT 278    Chemistries   Recent Labs Lab 04/13/15 1353 04/14/15 0641  NA 137 140  K 4.0 4.1  CL 109 113*  CO2 23 19*  GLUCOSE 191* 129*  BUN 17 14  CREATININE 1.04* 0.81  CALCIUM 8.5* 8.1*  AST 28  --   ALT 21  --   ALKPHOS 59  --   BILITOT 0.3  --     Cardiac Enzymes  Recent Labs Lab 04/13/15 1605  TROPONINI <0.03    Microbiology Results  No results found for this or any previous visit.  RADIOLOGY:  Ct Abdomen Pelvis W Contrast  04/13/2015   CLINICAL DATA:  Patient reports having colonoscopy 8 days ago. Noticed today having very dark stool.  EXAM: CT ABDOMEN AND PELVIS WITH CONTRAST  TECHNIQUE: Multidetector CT imaging of the abdomen and pelvis was performed using the standard protocol following bolus administration of intravenous contrast.  CONTRAST:  70mL OMNIPAQUE IOHEXOL 300 MG/ML  SOLN  COMPARISON:  Ultrasound the abdomen 08/04/2009  FINDINGS: Lower chest: No pulmonary nodules, pleural effusions, or infiltrates. Coronary artery calcifications are present. Hiatal hernia is noted.  Upper abdomen: No focal abnormality identified within the liver. The gallbladder is absent. Small low-attenuation lesions are identified within the spleen which are too small to be characterized. No focal abnormality identified within the pancreas or adrenal glands. Small cyst is identified in the lower pole the right kidney. No hydronephrosis.  Gastrointestinal tract: Hiatal hernia. Otherwise the stomach has a normal appearance. Small bowel loops are normal in appearance. Status post appendectomy. Numerous colonic diverticula are present. No associated inflammation or obstruction.  Pelvis: Status post hysterectomy. A 1.1 cm benign-appearing right adnexal mass is identified, likely ovarian cyst. No free pelvic fluid.  Retroperitoneum: There is dense atherosclerotic calcification of the tortuous abdominal aorta. No aneurysm. No retroperitoneal or  mesenteric adenopathy.  Abdominal wall: Unremarkable.  Osseous structures: Degenerative changes are seen in the lower lumbar spine, particularly at L3-4 where there is disc protrusion. There is disc protrusion, and disc height loss, and vacuum disc phenomenon at L4-5. No suspicious lytic or blastic lesions are identified.  IMPRESSION: 1. Diverticulosis without acute diverticulitis.  No colonic mass identified to account for the patient's symptoms. 2. Coronary artery disease. 3. Small splenic lesions are too small to characterize but favored to be benign. 4. Small cysts within the lower pole the right kidney. 5. Hiatal hernia. 6. Status post appendectomy. 7. Right adnexal cyst 1.1 cm. Given the benign appearance and size/menopausal status no further evaluation is necessary based on consensus criteria. 8. Degenerative disc disease. 9. Atherosclerotic calcification of the abdominal aorta.   Electronically Signed   By: Nolon Nations M.D.   On: 04/13/2015 18:42    EKG:   Orders placed or performed during the hospital encounter of 04/13/15  . ED EKG  . ED EKG      Management plans discussed with the patient, family and they are in agreement.  CODE STATUS:     Code Status Orders        Start     Ordered   04/13/15 2028  Full code   Continuous     04/13/15 2027      TOTAL TIME TAKING CARE OF THIS PATIENT: 35 minutes.    Vaughan Basta M.D on 04/15/2015 at 3:01 PM  Between 7am to 6pm - Pager - [phone]  After 6pm go to [email or web address] - password [email or web address] [facility]  Office  [phone]  CC: Primary care physician; Ezequiel Kayser, MD   Note: This dictation was prepared with Dragon dictation along with smaller phrase technology. Any transcriptional errors that result from this process are unintentional.

## 2015-04-15 NOTE — Progress Notes (Signed)
Pt tolerated new diet, no noted complaints, pt being discharged home, discharge reviewed with pt and husband states understanding, pt with no further stools this afternoon, per MD ok to discharge, pt with no noted complaints at discharge

## 2015-07-28 ENCOUNTER — Encounter: Payer: Self-pay | Admitting: *Deleted

## 2015-08-02 NOTE — Discharge Instructions (Signed)
Cataract Surgery, Care After °Refer to this sheet in the next [place] weeks. These instructions provide you with information on caring for yourself after your procedure. Your caregiver may also give you more specific instructions. Your treatment has been planned according to current medical practices, but problems sometimes occur. Call your caregiver if you have any problems or questions after your procedure.  °HOME CARE INSTRUCTIONS  °· Avoid strenuous activities as directed by your caregiver. °· Ask your caregiver when you can resume driving. °· Use eyedrops or other medicines to help healing and control pressure inside your eye as directed by your caregiver. °· Only take over-the-counter or prescription medicines for pain, discomfort, or fever as directed by your caregiver. °· Do not to touch or rub your eyes. °· You may be instructed to use a protective shield during the first [place] days and nights after surgery. If not, wear sunglasses to protect your eyes. This is to protect the eye from pressure or from being accidentally bumped. °· Keep the area around your eye clean and dry. Avoid swimming or allowing water to hit you directly in the face while showering. Keep soap and shampoo out of your eyes. °· Do not bend or lift heavy objects. Bending increases pressure in the eye. You can walk, climb stairs, and do light household chores. °· Do not put a contact lens into the eye that had surgery until your caregiver says it is okay to do so. °· Ask your doctor when you can return to work. This will depend on the kind of work that you do. If you work in a dusty environment, you may be advised to wear protective eyewear for a period of time. °· Ask your caregiver when it will be safe to engage in sexual activity. °· Continue with your regular eye exams as directed by your caregiver. °What to expect: °· It is normal to feel itching and mild discomfort for a [place] days after cataract surgery. Some fluid discharge is also common,  and your eye may be sensitive to light and touch. °· After 1 to 2 days, even moderate discomfort should disappear. In most cases, healing will take about 6 weeks. °· If you received an intraocular lens (IOL), you may notice that colors are very bright or have a blue tinge. Also, if you have been in bright sunlight, everything may appear reddish for a Arts hours. If you see these color tinges, it is because your lens is clear and no longer cloudy. Within a [place] months after receiving an IOL, these extra colors should go away. When you have healed, you will probably need new glasses. °SEEK MEDICAL CARE IF:  °· You have increased bruising around your eye. °· You have discomfort not helped by medicine. °SEEK IMMEDIATE MEDICAL CARE IF:  °· You have a  fever. °· You have a worsening or sudden vision loss. °· You have redness, swelling, or increasing pain in the eye. °· You have a thick discharge from the eye that had surgery. °MAKE SURE YOU: °· Understand these instructions. °· Will watch your condition. °· Will get help right away if you are not doing well or get worse. °  °This information is not intended to replace advice given to you by your health care provider. Make sure you discuss any questions you have with your health care provider. °  °Document Released: 01/26/2005 Document Revised: 07/30/2014 Document Reviewed: 03/02/2011 °Elsevier Interactive Patient Education ©2016 Elsevier Inc. ° °General Anesthesia, Adult, Care After °Refer to   this sheet in the next [place] weeks. These instructions provide you with information on caring for yourself after your procedure. Your health care provider may also give you more specific instructions. Your treatment has been planned according to current medical practices, but problems sometimes occur. Call your health care provider if you have any problems or questions after your procedure. °WHAT TO EXPECT AFTER THE PROCEDURE °After the procedure, it is typical to  experience: °· Sleepiness. °· Nausea and vomiting. °HOME CARE INSTRUCTIONS °· For the first 24 hours after general anesthesia: °¨ Have a responsible person with you. °¨ Do not drive a car. If you are alone, do not take public transportation. °¨ Do not drink alcohol. °¨ Do not take medicine that has not been prescribed by your health care provider. °¨ Do not sign important papers or make important decisions. °¨ You may resume a normal diet and activities as directed by your health care provider. °· Change bandages (dressings) as directed. °· If you have questions or problems that seem related to general anesthesia, call the hospital and ask for the anesthetist or anesthesiologist on call. °SEEK MEDICAL CARE IF: °· You have nausea and vomiting that continue the day after anesthesia. °· You develop a rash. °SEEK IMMEDIATE MEDICAL CARE IF:  °· You have difficulty breathing. °· You have chest pain. °· You have any allergic problems. °  °This information is not intended to replace advice given to you by your health care provider. Make sure you discuss any questions you have with your health care provider. °  °Document Released: 10/15/2000 Document Revised: 07/30/2014 Document Reviewed: 11/07/2011 °Elsevier Interactive Patient Education ©2016 Elsevier Inc. ° °

## 2015-08-02 NOTE — Anesthesia Preprocedure Evaluation (Signed)
Anesthesia Evaluation  Patient identified by MRN, date of birth, ID band Patient awake    Reviewed: Allergy & Precautions, NPO status , Patient's Chart, lab work & pertinent test results  History of Anesthesia Complications Negative for: history of anesthetic complications  Airway Mallampati: II  TM Distance: >3 FB Neck ROM: Full    Dental  (+) Partial Upper, Missing   Pulmonary neg pulmonary ROS,           Cardiovascular hypertension, Pt. on medications and Pt. on home beta blockers + dysrhythmias (Hx of SVT) Supra Ventricular Tachycardia   ECG: Sinus bradycardia    Neuro/Psych PT with memory loss.     GI/Hepatic GERD  Medicated,  Endo/Other  diabetes, Type 2, Oral Hypoglycemic Agents  Renal/GU CRFRenal disease     Musculoskeletal   Abdominal   Peds  Hematology   Anesthesia Other Findings   Reproductive/Obstetrics                             Anesthesia Physical  Anesthesia Plan  ASA: III  Anesthesia Plan: MAC   Post-op Pain Management:    Induction: Intravenous  Airway Management Planned:   Additional Equipment:   Intra-op Plan:   Post-operative Plan:   Informed Consent: I have reviewed the patients History and Physical, chart, labs and discussed the procedure including the risks, benefits and alternatives for the proposed anesthesia with the patient or authorized representative who has indicated his/her understanding and acceptance.     Plan Discussed with:   Anesthesia Plan Comments:         Anesthesia Quick Evaluation

## 2015-08-03 ENCOUNTER — Ambulatory Visit
Admission: RE | Admit: 2015-08-03 | Discharge: 2015-08-03 | Disposition: A | Discharge status: Home / Self Care | Payer: Medicare Other | Source: Ambulatory Visit | Attending: Ophthalmology | Admitting: Ophthalmology

## 2015-08-03 ENCOUNTER — Encounter
Admission: RE | Disposition: A | Discharge status: Home / Self Care | Payer: Self-pay | Source: Ambulatory Visit | Attending: Ophthalmology

## 2015-08-03 ENCOUNTER — Ambulatory Visit: Payer: Medicare Other | Admitting: Student in an Organized Health Care Education/Training Program

## 2015-08-03 DIAGNOSIS — E119 Type 2 diabetes mellitus without complications: Secondary | ICD-10-CM | POA: Insufficient documentation

## 2015-08-03 DIAGNOSIS — H2511 Age-related nuclear cataract, right eye: Secondary | ICD-10-CM | POA: Insufficient documentation

## 2015-08-03 DIAGNOSIS — E538 Deficiency of other specified B group vitamins: Secondary | ICD-10-CM | POA: Diagnosis not present

## 2015-08-03 DIAGNOSIS — F329 Major depressive disorder, single episode, unspecified: Secondary | ICD-10-CM | POA: Insufficient documentation

## 2015-08-03 DIAGNOSIS — I129 Hypertensive chronic kidney disease with stage 1 through stage 4 chronic kidney disease, or unspecified chronic kidney disease: Secondary | ICD-10-CM | POA: Diagnosis not present

## 2015-08-03 DIAGNOSIS — K219 Gastro-esophageal reflux disease without esophagitis: Secondary | ICD-10-CM | POA: Diagnosis not present

## 2015-08-03 DIAGNOSIS — Z794 Long term (current) use of insulin: Secondary | ICD-10-CM | POA: Insufficient documentation

## 2015-08-03 DIAGNOSIS — Z85828 Personal history of other malignant neoplasm of skin: Secondary | ICD-10-CM | POA: Diagnosis not present

## 2015-08-03 DIAGNOSIS — Z79899 Other long term (current) drug therapy: Secondary | ICD-10-CM | POA: Diagnosis not present

## 2015-08-03 DIAGNOSIS — E785 Hyperlipidemia, unspecified: Secondary | ICD-10-CM | POA: Diagnosis not present

## 2015-08-03 DIAGNOSIS — N189 Chronic kidney disease, unspecified: Secondary | ICD-10-CM | POA: Insufficient documentation

## 2015-08-03 DIAGNOSIS — F319 Bipolar disorder, unspecified: Secondary | ICD-10-CM | POA: Insufficient documentation

## 2015-08-03 HISTORY — DX: Depression, unspecified: F32.A

## 2015-08-03 HISTORY — PX: CATARACT EXTRACTION W/PHACO: SHX586

## 2015-08-03 HISTORY — DX: Presence of dental prosthetic device (complete) (partial): Z97.2

## 2015-08-03 HISTORY — DX: Major depressive disorder, single episode, unspecified: F32.9

## 2015-08-03 LAB — GLUCOSE, CAPILLARY
GLUCOSE-CAPILLARY: 98 mg/dL (ref 65–99)
GLUCOSE-CAPILLARY: 107 mg/dL — AB (ref 65–99)

## 2015-08-03 SURGERY — PHACOEMULSIFICATION, CATARACT, WITH IOL INSERTION
Anesthesia: Monitor Anesthesia Care | Laterality: Right | Wound class: Clean

## 2015-08-03 MED ORDER — MIDAZOLAM HCL 2 MG/2ML IJ SOLN
INTRAMUSCULAR | Status: DC | PRN
  Administered 2015-08-03: 0.5 mg via INTRAVENOUS

## 2015-08-03 MED ORDER — ACETAMINOPHEN 325 MG PO TABS: 325.0000 mg | ORAL_TABLET | ORAL | Status: DC | PRN

## 2015-08-03 MED ORDER — ARMC OPHTHALMIC DILATING GEL
1.0000 "application " | OPHTHALMIC | Status: DC | PRN
  Administered 2015-08-03 (×2): 1 via OPHTHALMIC

## 2015-08-03 MED ORDER — NA HYALUR & NA CHOND-NA HYALUR 0.4-0.35 ML IO KIT
PACK | INTRAOCULAR | Status: DC | PRN
  Administered 2015-08-03: 1 mL via INTRAOCULAR

## 2015-08-03 MED ORDER — POVIDONE-IODINE 5 % OP SOLN
1.0000 "application " | OPHTHALMIC | Status: DC | PRN
  Administered 2015-08-03: 1 via OPHTHALMIC

## 2015-08-03 MED ORDER — BRIMONIDINE TARTRATE 0.2 % OP SOLN
OPHTHALMIC | Status: DC | PRN
  Administered 2015-08-03: 1 [drp] via OPHTHALMIC

## 2015-08-03 MED ORDER — EPINEPHRINE HCL 1 MG/ML IJ SOLN
INTRAOCULAR | Status: DC | PRN
  Administered 2015-08-03: 114 mL via OPHTHALMIC

## 2015-08-03 MED ORDER — TIMOLOL MALEATE 0.5 % OP SOLN
OPHTHALMIC | Status: DC | PRN
  Administered 2015-08-03: 1 [drp] via OPHTHALMIC

## 2015-08-03 MED ORDER — CEFUROXIME OPHTHALMIC INJECTION 1 MG/0.1 ML
INJECTION | OPHTHALMIC | Status: DC | PRN
  Administered 2015-08-03: 0.1 mL via OPHTHALMIC

## 2015-08-03 MED ORDER — ACETAMINOPHEN 160 MG/5ML PO SOLN: 325.0000 mg | ORAL | Status: DC | PRN

## 2015-08-03 MED ORDER — TETRACAINE HCL 0.5 % OP SOLN
1.0000 [drp] | OPHTHALMIC | Status: DC | PRN
  Administered 2015-08-03: 1 [drp] via OPHTHALMIC

## 2015-08-03 MED ORDER — FENTANYL CITRATE (PF) 100 MCG/2ML IJ SOLN
INTRAMUSCULAR | Status: DC | PRN
  Administered 2015-08-03: 25 ug via INTRAVENOUS

## 2015-08-03 SURGICAL SUPPLY — 27 items
CANNULA ANT/CHMB 27GA (MISCELLANEOUS) ×3 IMPLANT
CARTRIDGE ABBOTT (MISCELLANEOUS) ×3 IMPLANT
GLOVE SURG LX 7.5 STRW (GLOVE) ×2
GLOVE SURG LX STRL 7.5 STRW (GLOVE) ×1 IMPLANT
GLOVE SURG TRIUMPH 8.0 PF LTX (GLOVE) ×3 IMPLANT
GOWN STRL REUS W/ TWL LRG LVL3 (GOWN DISPOSABLE) ×2 IMPLANT
GOWN STRL REUS W/TWL LRG LVL3 (GOWN DISPOSABLE) ×4
LENS IOL TECNIS 19.0 (Intraocular Lens) ×3 IMPLANT
LENS IOL TECNIS MONO 1P 19.0 (Intraocular Lens) ×1 IMPLANT
MARKER SKIN SURG W/RULER VIO (MISCELLANEOUS) ×3 IMPLANT
NDL RETROBULBAR .5 NSTRL (NEEDLE) IMPLANT
NEEDLE FILTER BLUNT 18X 1/2SAF (NEEDLE) ×2
NEEDLE FILTER BLUNT 18X1 1/2 (NEEDLE) ×1 IMPLANT
PACK CATARACT BRASINGTON (MISCELLANEOUS) ×3 IMPLANT
PACK EYE AFTER SURG (MISCELLANEOUS) ×3 IMPLANT
PACK OPTHALMIC (MISCELLANEOUS) ×3 IMPLANT
RING MALYGIN 7.0 (MISCELLANEOUS) IMPLANT
SUT ETHILON 10-0 CS-B-6CS-B-6 (SUTURE)
SUT VICRYL  9 0 (SUTURE)
SUT VICRYL 9 0 (SUTURE) IMPLANT
SUTURE EHLN 10-0 CS-B-6CS-B-6 (SUTURE) IMPLANT
SYR 3ML LL SCALE MARK (SYRINGE) ×3 IMPLANT
SYR 5ML LL (SYRINGE) IMPLANT
SYR TB 1ML LUER SLIP (SYRINGE) ×3 IMPLANT
WATER STERILE IRR 250ML POUR (IV SOLUTION) ×3 IMPLANT
WATER STERILE IRR 500ML POUR (IV SOLUTION) IMPLANT
WIPE NON LINTING 3.25X3.25 (MISCELLANEOUS) ×3 IMPLANT

## 2015-08-03 NOTE — Anesthesia Procedure Notes (Signed)
Procedure Name: MAC Performed by: Roshan Roback Pre-anesthesia Checklist: Patient identified, Emergency Drugs available, Suction available, Patient being monitored and Timeout performed Patient Re-evaluated:Patient Re-evaluated prior to inductionOxygen Delivery Method: Nasal cannula Preoxygenation: Pre-oxygenation with 100% oxygen       

## 2015-08-03 NOTE — H&P (Signed)
  The History and Physical notes are on paper, have been signed, and are to be scanned. The patient remains stable and unchanged from the H&P.   Previous H&P reviewed, patient examined, and there are no changes.  Sharon Dickson 08/03/2015 7:42 AM

## 2015-08-03 NOTE — Transfer of Care (Signed)
Immediate Anesthesia Transfer of Care Note  Patient: Sharon Dickson  Procedure(s) Performed: Procedure(s) with comments: CATARACT EXTRACTION PHACO AND INTRAOCULAR LENS PLACEMENT (IOC) (Right) - DIABETIC - insulin  Patient Location: PACU  Anesthesia Type: MAC  Level of Consciousness: awake, alert  and patient cooperative  Airway and Oxygen Therapy: Patient Spontanous Breathing and Patient connected to supplemental oxygen  Post-op Assessment: Post-op Vital signs reviewed, Patient's Cardiovascular Status Stable, Respiratory Function Stable, Patent Airway and No signs of Nausea or vomiting  Post-op Vital Signs: Reviewed and stable  Complications: No apparent anesthesia complications

## 2015-08-03 NOTE — Op Note (Signed)
LOCATION:  [place]   PREOPERATIVE DIAGNOSIS:    Nuclear sclerotic cataract right eye. H25.11   POSTOPERATIVE DIAGNOSIS:  Nuclear sclerotic cataract right eye.     PROCEDURE:  Phacoemusification with posterior chamber intraocular lens placement of the right eye   LENS:   Implant Name Type Inv. Item Serial No. Manufacturer Lot No. LRB No. Used  LENS IMPL INTRAOC [id number] 19.0 - [id number] Intraocular Lens LENS IMPL INTRAOC [id number] 19.0 [id number] AMO   Right 1        ULTRASOUND TIME: 10.7 % of 1 minutes, 32 seconds.  CDE 10.0   SURGEON:  Wyonia Hough, MD   ANESTHESIA:  Topical with tetracaine drops and 2% Xylocaine jelly.   COMPLICATIONS:  None.   DESCRIPTION OF PROCEDURE:  The patient was identified in the holding room and transported to the operating room and placed in the supine position under the operating microscope.  The right eye was identified as the operative eye and it was prepped and draped in the usual sterile ophthalmic fashion.   A 1 millimeter clear-corneal paracentesis was made at the 12:00 position.  The anterior chamber was filled with Viscoat viscoelastic.  A 2.4 millimeter keratome was used to make a near-clear corneal incision at the 9:00 position.  A curvilinear capsulorrhexis was made with a cystotome and capsulorrhexis forceps.  Balanced salt solution was used to hydrodissect and hydrodelineate the nucleus.   Phacoemulsification was then used in stop and chop fashion to remove the lens nucleus and epinucleus.  The remaining cortex was then removed using the irrigation and aspiration handpiece. Provisc was then placed into the capsular bag to distend it for lens placement.  A lens was then injected into the capsular bag.  The remaining viscoelastic was aspirated.   Wounds were hydrated with balanced salt solution.  The anterior chamber was inflated to a physiologic pressure with balanced salt solution.  No wound leaks were noted. Cefuroxime 0.1 ml  of a 10mg /ml solution was injected into the anterior chamber for a dose of 1 mg of intracameral antibiotic at the completion of the case.   Timolol and Brimonidine drops were applied to the eye.  The patient was taken to the recovery room in stable condition without complications of anesthesia or surgery.   Aleshka Corney 08/03/2015, 8:15 AM

## 2015-08-03 NOTE — Anesthesia Postprocedure Evaluation (Signed)
Anesthesia Post Note  Patient: Sharon Dickson  Procedure(s) Performed: Procedure(s) (LRB): CATARACT EXTRACTION PHACO AND INTRAOCULAR LENS PLACEMENT (IOC) (Right)  Patient location during evaluation: PACU Anesthesia Type: MAC Level of consciousness: awake and alert Pain management: pain level controlled Vital Signs Assessment: post-procedure vital signs reviewed and stable Respiratory status: spontaneous breathing, nonlabored ventilation, respiratory function stable and patient connected to nasal cannula oxygen Cardiovascular status: stable and blood pressure returned to baseline Anesthetic complications: no    Amaryllis Dyke

## 2015-08-04 ENCOUNTER — Encounter: Payer: Self-pay | Admitting: Ophthalmology

## 2015-09-19 ENCOUNTER — Ambulatory Visit: Payer: Medicare Other | Attending: Internal Medicine | Admitting: Physical Therapy

## 2015-09-19 DIAGNOSIS — R269 Unspecified abnormalities of gait and mobility: Secondary | ICD-10-CM

## 2015-09-19 DIAGNOSIS — M6281 Muscle weakness (generalized): Secondary | ICD-10-CM | POA: Diagnosis present

## 2015-09-20 ENCOUNTER — Encounter: Payer: Self-pay | Admitting: Physical Therapy

## 2015-09-20 NOTE — Therapy (Signed)
[place] [facility] [address]. [place], [place], [address] Phone: [phone]   Fax:  [phone]  Physical Therapy Evaluation  Patient Details  Name: Chelesea Viscardi Sabino MRN: [id number] Date of Birth: 13-May-1934 Referring Provider: Dr. Dorthula Perfect  Encounter Date: 09/19/2015      PT End of Session - 09/20/15 1851    Visit Number 1   Number of Visits 8   Date for PT Re-Evaluation 10/17/15   Authorization - Visit Number 1   Authorization - Number of Visits 10   PT Start Time [id number]   PT Stop Time 1534   PT Time Calculation (min) 59 min   Equipment Utilized During Treatment Gait belt   Activity Tolerance Patient tolerated treatment well   Behavior During Therapy [facility] for tasks assessed/performed      Past Medical History  Diagnosis Date  . Cancer ([place])     skin ca  . Chronic kidney disease   . GERD (gastroesophageal reflux disease)   . Bipolar disorder ([place])   . Anxiety   . Hypertension   . Diabetes mellitus without complication ([place])   . Hyperlipidemia   . Proteinuria   . Allergic rhinitis   . Benign essential tremor   . Anemia   . Vitamin B 12 deficiency   . Cognitive impairment   . UTI (urinary tract infection)   . SVT (supraventricular tachycardia) ([place])   . Cystocele   . Cystic disease of breast   . Diverticulosis   . Depression   . Wears dentures     partial lower    Past Surgical History  Procedure Laterality Date  . Appendectomy    . Abdominal hysterectomy    . Cholecystectomy    . Bladder surgery  10/2008  . Colonoscopy  11/09/2014  . Colonoscopy with propofol N/A 04/05/2015    Procedure: COLONOSCOPY WITH PROPOFOL;  Surgeon: Lollie Sails, MD;  Location: [facility] ENDOSCOPY;  Service: Endoscopy;  Laterality: N/A;  . Cataract extraction w/phaco Right 08/03/2015    Procedure: CATARACT EXTRACTION PHACO AND INTRAOCULAR LENS PLACEMENT (IOC);  Surgeon: Leandrew Koyanagi, MD;  Location: [place];  Service:  Ophthalmology;  Laterality: Right;  DIABETIC - insulin    There were no vitals filed for this visit.  Visit Diagnosis:  Gait difficulty  Muscle weakness      Subjective Assessment - 09/20/15 1849    Subjective Pt. reports several recent falls (backwards) at home and a restaurant with no severe injuries.  Pt. reports no pain at this time and states she probably needs a cane but is not really interested in using.     Patient Stated Goals increase B LE muscle strength/ improve balance/ gait.    Currently in Pain? No/denies      Gait training:  Pt. Instructed in proper use of SPC with 2-point gait pattern (not appropriate at this time).  Use of rollator required for safety with mod. I on level surfaces and will outside ambulation.        [facility] PT Assessment - 09/20/15 0001    Assessment   Medical Diagnosis Postural Imbalance   Referring Provider Dr. Dorthula Perfect   Onset Date/Surgical Date 07/24/15   Balance Screen   Has the patient fallen in the past 6 months Yes   How many times? 3   Has the patient had a decrease in activity level because of a fear of falling?  Yes   Is the patient reluctant to leave their home  because of a fear of falling?  No            PT Education - 09/20/15 1850    Education provided Yes   Education Details Use of rollator   Person(s) Educated Patient   Methods Explanation;Demonstration;Verbal cues   Comprehension Verbalized understanding;Returned demonstration;Verbal cues required;Need further instruction             PT Long Term Goals - 09/20/15 1919    PT LONG TERM GOAL #1   Title Pt. I with HEP to increase B hip/LE strength to 4+/5 MMT to improve gait/balance.     Time 4   Period Weeks   Status New   PT LONG TERM GOAL #2   Title Pt. will increase Berg balance test to >50/56 to promote safety/ decrease fall risk.    Baseline 45/56 on 2/28   Time 4   Period Weeks   Status New   PT LONG TERM GOAL #3   Title Pt. able to ambulate community  distance with appropriate assistive device and no LOB safely.     Time 4   Period Weeks   Status New   PT LONG TERM GOAL #4   Title Pt. will report no LOB or falls consistently for 1 week to promote greater independence with daily mobility.     Time 4   Period Weeks   Status New               Plan - 09/20/15 1854    Clinical Impression Statement Pt. is a 82 y/o female with reports of several falls (backwards) while at home in bathroom and a restaurant.  Pt. reports no pain or injuries from recent falls but is concerned about falling again.  Pt. presents with B hip/LE AROM WNL and strength grossly 4+/5 MMT except hip flexion 4/5 MMT.  Pt. confused about simple questions concerning recent events/ falls and unable to provide details.  Berg balance test: 45/56.  Pt. ambulates with slow, antalgic gait pattern with several LOB (backwards) while standing and talking or turning at end of hall.  Pt. benefits from [place] for safety with walking.  Pt. instructed in use of SPC and 2-point gait pattern but unable to understand sequencing/ proper use of SPC.  Pt. benefits from use of rollator with walking in clinic/ outside for safelty to decrease fall risk.  Pt. will benefit from skilled PT services to increase B LE muscle strength and gait training with appropriate assistive  device to promote safelty/ decease fall risk.     Pt will benefit from skilled therapeutic intervention in order to improve on the following deficits Abnormal gait;Decreased strength;Improper body mechanics;Postural dysfunction;Difficulty walking;Decreased activity tolerance;Decreased mobility;Decreased balance;Decreased cognition;Decreased coordination;Decreased endurance   Rehab Potential Good   PT Frequency 2x / week   PT Duration 4 weeks   PT Treatment/Interventions ADLs/Self Care Home Management;Patient/family education;Neuromuscular re-education;Balance training;Therapeutic exercise;Therapeutic activities;Functional mobility  training;Stair training;Gait training;DME Instruction   PT Next Visit Plan reassess proper use of rollator/ issue HEP          G-Codes - Oct 01, 2015 1852    Functional Assessment Tool Used Gait difficulty/ Berg balance test/ muscle weakness   Functional Limitation Mobility: Walking and moving around   Mobility: Walking and Moving Around Current Status [id number]) At least 40 percent but less than 60 percent impaired, limited or restricted   Mobility: Walking and Moving Around Goal Status [id number]) At least 1 percent but less than 20 percent impaired, limited or restricted  Problem List Patient Active Problem List   Diagnosis Date Noted  . Melena   . GIB (gastrointestinal bleeding) 04/13/2015  . Anemia 04/13/2015   Pura Spice, PT, DPT # [phone]   09/20/2015, 7:23 PM  [facility] [address] [place], [place], [address] Phone: [phone]   Fax:  [phone]  Name: Gitel Nissley Carrigg MRN: [id number] Date of Birth: 12/07/1933

## 2015-09-22 ENCOUNTER — Encounter: Payer: Self-pay | Admitting: *Deleted

## 2015-09-22 ENCOUNTER — Encounter: Payer: Medicare Other | Admitting: Physical Therapy

## 2015-09-23 NOTE — Discharge Instructions (Signed)
Cataract Surgery, Care After °Refer to this sheet in the next [place] weeks. These instructions provide you with information on caring for yourself after your procedure. Your caregiver may also give you more specific instructions. Your treatment has been planned according to current medical practices, but problems sometimes occur. Call your caregiver if you have any problems or questions after your procedure.  °HOME CARE INSTRUCTIONS  °· Avoid strenuous activities as directed by your caregiver. °· Ask your caregiver when you can resume driving. °· Use eyedrops or other medicines to help healing and control pressure inside your eye as directed by your caregiver. °· Only take over-the-counter or prescription medicines for pain, discomfort, or fever as directed by your caregiver. °· Do not to touch or rub your eyes. °· You may be instructed to use a protective shield during the first [place] days and nights after surgery. If not, wear sunglasses to protect your eyes. This is to protect the eye from pressure or from being accidentally bumped. °· Keep the area around your eye clean and dry. Avoid swimming or allowing water to hit you directly in the face while showering. Keep soap and shampoo out of your eyes. °· Do not bend or lift heavy objects. Bending increases pressure in the eye. You can walk, climb stairs, and do light household chores. °· Do not put a contact lens into the eye that had surgery until your caregiver says it is okay to do so. °· Ask your doctor when you can return to work. This will depend on the kind of work that you do. If you work in a dusty environment, you may be advised to wear protective eyewear for a period of time. °· Ask your caregiver when it will be safe to engage in sexual activity. °· Continue with your regular eye exams as directed by your caregiver. °What to expect: °· It is normal to feel itching and mild discomfort for a [place] days after cataract surgery. Some fluid discharge is also common,  and your eye may be sensitive to light and touch. °· After 1 to 2 days, even moderate discomfort should disappear. In most cases, healing will take about 6 weeks. °· If you received an intraocular lens (IOL), you may notice that colors are very bright or have a blue tinge. Also, if you have been in bright sunlight, everything may appear reddish for a [place] hours. If you see these color tinges, it is because your lens is clear and no longer cloudy. Within a [place] months after receiving an IOL, these extra colors should go away. When you have healed, you will probably need new glasses. °SEEK MEDICAL CARE IF:  °· You have increased bruising around your eye. °· You have discomfort not helped by medicine. °SEEK IMMEDIATE MEDICAL CARE IF:  °· You have a  fever. °· You have a worsening or sudden vision loss. °· You have redness, swelling, or increasing pain in the eye. °· You have a thick discharge from the eye that had surgery. °MAKE SURE YOU: °· Understand these instructions. °· Will watch your condition. °· Will get help right away if you are not doing well or get worse. °  °This information is not intended to replace advice given to you by your health care provider. Make sure you discuss any questions you have with your health care provider. °  °Document Released: 01/26/2005 Document Revised: 07/30/2014 Document Reviewed: 03/02/2011 °Elsevier Interactive Patient Education ©2016 Elsevier Inc. ° °General Anesthesia, Adult, Care After °Refer to   this sheet in the next [place] weeks. These instructions provide you with information on caring for yourself after your procedure. Your health care provider may also give you more specific instructions. Your treatment has been planned according to current medical practices, but problems sometimes occur. Call your health care provider if you have any problems or questions after your procedure. °WHAT TO EXPECT AFTER THE PROCEDURE °After the procedure, it is typical to  experience: °· Sleepiness. °· Nausea and vomiting. °HOME CARE INSTRUCTIONS °· For the first 24 hours after general anesthesia: °¨ Have a responsible person with you. °¨ Do not drive a car. If you are alone, do not take public transportation. °¨ Do not drink alcohol. °¨ Do not take medicine that has not been prescribed by your health care provider. °¨ Do not sign important papers or make important decisions. °¨ You may resume a normal diet and activities as directed by your health care provider. °· Change bandages (dressings) as directed. °· If you have questions or problems that seem related to general anesthesia, call the hospital and ask for the anesthetist or anesthesiologist on call. °SEEK MEDICAL CARE IF: °· You have nausea and vomiting that continue the day after anesthesia. °· You develop a rash. °SEEK IMMEDIATE MEDICAL CARE IF:  °· You have difficulty breathing. °· You have chest pain. °· You have any allergic problems. °  °This information is not intended to replace advice given to you by your health care provider. Make sure you discuss any questions you have with your health care provider. °  °Document Released: 10/15/2000 Document Revised: 07/30/2014 Document Reviewed: 11/07/2011 °Elsevier Interactive Patient Education ©2016 Elsevier Inc. ° °

## 2015-09-26 ENCOUNTER — Encounter: Payer: Medicare Other | Admitting: Physical Therapy

## 2015-09-27 ENCOUNTER — Encounter: Payer: Medicare Other | Admitting: Physical Therapy

## 2015-09-28 ENCOUNTER — Encounter
Admission: RE | Disposition: A | Discharge status: Home / Self Care | Payer: Self-pay | Source: Ambulatory Visit | Attending: Ophthalmology

## 2015-09-28 ENCOUNTER — Ambulatory Visit: Payer: Medicare Other | Admitting: Anesthesiology

## 2015-09-28 ENCOUNTER — Ambulatory Visit
Admission: RE | Admit: 2015-09-28 | Discharge: 2015-09-28 | Disposition: A | Discharge status: Home / Self Care | Payer: Medicare Other | Source: Ambulatory Visit | Attending: Ophthalmology | Admitting: Ophthalmology

## 2015-09-28 ENCOUNTER — Encounter: Payer: Self-pay | Admitting: *Deleted

## 2015-09-28 DIAGNOSIS — Z7951 Long term (current) use of inhaled steroids: Secondary | ICD-10-CM | POA: Diagnosis not present

## 2015-09-28 DIAGNOSIS — Z794 Long term (current) use of insulin: Secondary | ICD-10-CM | POA: Insufficient documentation

## 2015-09-28 DIAGNOSIS — K589 Irritable bowel syndrome without diarrhea: Secondary | ICD-10-CM | POA: Diagnosis not present

## 2015-09-28 DIAGNOSIS — H9190 Unspecified hearing loss, unspecified ear: Secondary | ICD-10-CM | POA: Insufficient documentation

## 2015-09-28 DIAGNOSIS — Z888 Allergy status to other drugs, medicaments and biological substances status: Secondary | ICD-10-CM | POA: Diagnosis not present

## 2015-09-28 DIAGNOSIS — Z9841 Cataract extraction status, right eye: Secondary | ICD-10-CM | POA: Insufficient documentation

## 2015-09-28 DIAGNOSIS — E7800 Pure hypercholesterolemia, unspecified: Secondary | ICD-10-CM | POA: Insufficient documentation

## 2015-09-28 DIAGNOSIS — Z79899 Other long term (current) drug therapy: Secondary | ICD-10-CM | POA: Insufficient documentation

## 2015-09-28 DIAGNOSIS — H2512 Age-related nuclear cataract, left eye: Secondary | ICD-10-CM | POA: Diagnosis not present

## 2015-09-28 DIAGNOSIS — Z9049 Acquired absence of other specified parts of digestive tract: Secondary | ICD-10-CM | POA: Diagnosis not present

## 2015-09-28 DIAGNOSIS — Z885 Allergy status to narcotic agent status: Secondary | ICD-10-CM | POA: Diagnosis not present

## 2015-09-28 DIAGNOSIS — T7840XA Allergy, unspecified, initial encounter: Secondary | ICD-10-CM | POA: Diagnosis not present

## 2015-09-28 DIAGNOSIS — Z90710 Acquired absence of both cervix and uterus: Secondary | ICD-10-CM | POA: Insufficient documentation

## 2015-09-28 DIAGNOSIS — K219 Gastro-esophageal reflux disease without esophagitis: Secondary | ICD-10-CM | POA: Diagnosis not present

## 2015-09-28 DIAGNOSIS — H269 Unspecified cataract: Secondary | ICD-10-CM | POA: Diagnosis present

## 2015-09-28 DIAGNOSIS — K5790 Diverticulosis of intestine, part unspecified, without perforation or abscess without bleeding: Secondary | ICD-10-CM | POA: Insufficient documentation

## 2015-09-28 DIAGNOSIS — E119 Type 2 diabetes mellitus without complications: Secondary | ICD-10-CM | POA: Diagnosis not present

## 2015-09-28 DIAGNOSIS — Z9889 Other specified postprocedural states: Secondary | ICD-10-CM | POA: Insufficient documentation

## 2015-09-28 DIAGNOSIS — I10 Essential (primary) hypertension: Secondary | ICD-10-CM | POA: Diagnosis not present

## 2015-09-28 HISTORY — PX: CATARACT EXTRACTION W/PHACO: SHX586

## 2015-09-28 LAB — GLUCOSE, CAPILLARY: GLUCOSE-CAPILLARY: 173 mg/dL — AB (ref 65–99)

## 2015-09-28 SURGERY — PHACOEMULSIFICATION, CATARACT, WITH IOL INSERTION
Anesthesia: Monitor Anesthesia Care | Site: Eye | Laterality: Left | Wound class: Clean

## 2015-09-28 MED ORDER — FENTANYL CITRATE (PF) 100 MCG/2ML IJ SOLN
INTRAMUSCULAR | Status: DC | PRN
  Administered 2015-09-28 (×2): 50 ug via INTRAVENOUS

## 2015-09-28 MED ORDER — MIDAZOLAM HCL 2 MG/2ML IJ SOLN
INTRAMUSCULAR | Status: DC | PRN
  Administered 2015-09-28: 2 mg via INTRAVENOUS

## 2015-09-28 MED ORDER — TETRACAINE HCL 0.5 % OP SOLN
1.0000 [drp] | OPHTHALMIC | Status: DC | PRN
  Administered 2015-09-28: 1 [drp] via OPHTHALMIC

## 2015-09-28 MED ORDER — ARMC OPHTHALMIC DILATING GEL
1.0000 "application " | OPHTHALMIC | Status: DC | PRN
  Administered 2015-09-28 (×2): 1 via OPHTHALMIC

## 2015-09-28 MED ORDER — TETRACAINE 0.5 % OP SOLN OPTIME - NO CHARGE
OPHTHALMIC | Status: DC | PRN
  Administered 2015-09-28: 4 [drp] via OPHTHALMIC

## 2015-09-28 MED ORDER — EPINEPHRINE HCL 1 MG/ML IJ SOLN
INTRAOCULAR | Status: DC | PRN
  Administered 2015-09-28: 79 mL via OPHTHALMIC

## 2015-09-28 MED ORDER — TIMOLOL MALEATE 0.5 % OP SOLN
OPHTHALMIC | Status: DC | PRN
  Administered 2015-09-28: 1 [drp] via OPHTHALMIC

## 2015-09-28 MED ORDER — BRIMONIDINE TARTRATE 0.2 % OP SOLN
OPHTHALMIC | Status: DC | PRN
  Administered 2015-09-28: 1 [drp] via OPHTHALMIC

## 2015-09-28 MED ORDER — POVIDONE-IODINE 5 % OP SOLN
1.0000 "application " | OPHTHALMIC | Status: DC | PRN
  Administered 2015-09-28: 1 via OPHTHALMIC

## 2015-09-28 MED ORDER — CEFUROXIME OPHTHALMIC INJECTION 1 MG/0.1 ML
INJECTION | OPHTHALMIC | Status: DC | PRN
  Administered 2015-09-28: 0.1 mL via INTRACAMERAL

## 2015-09-28 MED ORDER — NA HYALUR & NA CHOND-NA HYALUR 0.4-0.35 ML IO KIT
PACK | INTRAOCULAR | Status: DC | PRN
  Administered 2015-09-28: 1 mL via INTRAOCULAR

## 2015-09-28 SURGICAL SUPPLY — 26 items
CANNULA ANT/CHMB 27GA (MISCELLANEOUS) ×3 IMPLANT
CARTRIDGE ABBOTT (MISCELLANEOUS) ×3 IMPLANT
GLOVE SURG LX 7.5 STRW (GLOVE) ×2
GLOVE SURG LX STRL 7.5 STRW (GLOVE) ×1 IMPLANT
GLOVE SURG TRIUMPH 8.0 PF LTX (GLOVE) ×3 IMPLANT
GOWN STRL REUS W/ TWL LRG LVL3 (GOWN DISPOSABLE) ×2 IMPLANT
GOWN STRL REUS W/TWL LRG LVL3 (GOWN DISPOSABLE) ×4
LENS IOL TECNIS ITEC 19.0 (Intraocular Lens) ×3 IMPLANT
MARKER SKIN DUAL TIP RULER LAB (MISCELLANEOUS) ×3 IMPLANT
NDL RETROBULBAR .5 NSTRL (NEEDLE) IMPLANT
NEEDLE FILTER BLUNT 18X 1/2SAF (NEEDLE) ×2
NEEDLE FILTER BLUNT 18X1 1/2 (NEEDLE) ×1 IMPLANT
PACK CATARACT BRASINGTON (MISCELLANEOUS) ×3 IMPLANT
PACK EYE AFTER SURG (MISCELLANEOUS) ×3 IMPLANT
PACK OPTHALMIC (MISCELLANEOUS) ×3 IMPLANT
RING MALYGIN 7.0 (MISCELLANEOUS) IMPLANT
SUT ETHILON 10-0 CS-B-6CS-B-6 (SUTURE)
SUT VICRYL  9 0 (SUTURE)
SUT VICRYL 9 0 (SUTURE) IMPLANT
SUTURE EHLN 10-0 CS-B-6CS-B-6 (SUTURE) IMPLANT
SYR 3ML LL SCALE MARK (SYRINGE) ×3 IMPLANT
SYR 5ML LL (SYRINGE) IMPLANT
SYR TB 1ML LUER SLIP (SYRINGE) ×3 IMPLANT
WATER STERILE IRR 250ML POUR (IV SOLUTION) ×3 IMPLANT
WATER STERILE IRR 500ML POUR (IV SOLUTION) IMPLANT
WIPE NON LINTING 3.25X3.25 (MISCELLANEOUS) ×3 IMPLANT

## 2015-09-28 NOTE — H&P (Signed)
  The History and Physical notes are on paper, have been signed, and are to be scanned. The patient remains stable and unchanged from the H&P.   Previous H&P reviewed, patient examined, and there are no changes.  Sharon Dickson 09/28/2015 8:39 AM

## 2015-09-28 NOTE — Anesthesia Postprocedure Evaluation (Signed)
Anesthesia Post Note  Patient: Sharon Dickson  Procedure(s) Performed: Procedure(s) (LRB): CATARACT EXTRACTION PHACO AND INTRAOCULAR LENS PLACEMENT (IOC) (Left)  Patient location during evaluation: PACU Anesthesia Type: MAC Level of consciousness: awake and alert and oriented Pain management: pain level controlled Vital Signs Assessment: post-procedure vital signs reviewed and stable Respiratory status: spontaneous breathing and nonlabored ventilation Cardiovascular status: stable Postop Assessment: no signs of nausea or vomiting and adequate PO intake Anesthetic complications: no    Estill Batten

## 2015-09-28 NOTE — Anesthesia Preprocedure Evaluation (Signed)
Anesthesia Evaluation  Patient identified by MRN, date of birth, ID band  Reviewed: Allergy & Precautions, NPO status , Patient's Chart, lab work & pertinent test results, reviewed documented beta blocker date and time   History of Anesthesia Complications Negative for: history of anesthetic complications  Airway Mallampati: I  TM Distance: >3 FB Neck ROM: Full    Dental  (+) Partial Lower   Pulmonary    Pulmonary exam normal        Cardiovascular hypertension, Pt. on medications and Pt. on home beta blockers Normal cardiovascular exam     Neuro/Psych PSYCHIATRIC DISORDERS Anxiety Depression Bipolar Disorder    GI/Hepatic GERD  Medicated and Controlled,  Endo/Other  diabetes, Type 2, Oral Hypoglycemic Agents, Insulin Dependent  Renal/GU      Musculoskeletal   Abdominal   Peds  Hematology negative hematology ROS (+)   Anesthesia Other Findings   Reproductive/Obstetrics                             Anesthesia Physical Anesthesia Plan  ASA: III  Anesthesia Plan: MAC   Post-op Pain Management:    Induction: Intravenous  Airway Management Planned:   Additional Equipment:   Intra-op Plan:   Post-operative Plan:   Informed Consent: I have reviewed the patients History and Physical, chart, labs and discussed the procedure including the risks, benefits and alternatives for the proposed anesthesia with the patient or authorized representative who has indicated his/her understanding and acceptance.     Plan Discussed with: CRNA  Anesthesia Plan Comments:         Anesthesia Quick Evaluation

## 2015-09-28 NOTE — Transfer of Care (Signed)
Immediate Anesthesia Transfer of Care Note  Patient: Sharon Dickson  Procedure(s) Performed: Procedure(s) with comments: CATARACT EXTRACTION PHACO AND INTRAOCULAR LENS PLACEMENT ([place]) (Left) - DIABETIC LEAVE PT 3RD  Patient Location: PACU  Anesthesia Type: MAC  Level of Consciousness: awake, alert  and patient cooperative  Airway and Oxygen Therapy: Patient Spontanous Breathing and Patient connected to supplemental oxygen  Post-op Assessment: Post-op Vital signs reviewed, Patient's Cardiovascular Status Stable, Respiratory Function Stable, Patent Airway and No signs of Nausea or vomiting  Post-op Vital Signs: Reviewed and stable  Complications: No apparent anesthesia complications

## 2015-09-28 NOTE — Op Note (Signed)
OPERATIVE NOTE  Sharon Dickson [id number] 09/28/2015   PREOPERATIVE DIAGNOSIS:  Nuclear sclerotic cataract left eye. H25.12   POSTOPERATIVE DIAGNOSIS:    Nuclear sclerotic cataract left eye.     PROCEDURE:  Phacoemusification with posterior chamber intraocular lens placement of the left eye   LENS:   Implant Name Type Inv. Item Serial No. Manufacturer Lot No. LRB No. Used  technis aspheric iol pre loaded lens     [id number] [facility]   Left 1     PCB00 19.0 D PCIOL   ULTRASOUND TIME: 13  % of 1 minutes 27 seconds, CDE 11.6  SURGEON:  Wyonia Hough, MD   ANESTHESIA:  Topical with tetracaine drops and 2% Xylocaine jelly.   COMPLICATIONS:  None.   DESCRIPTION OF PROCEDURE:  The patient was identified in the holding room and transported to the operating room and placed in the supine position under the operating microscope.  The left eye was identified as the operative eye and it was prepped and draped in the usual sterile ophthalmic fashion.   A 1 millimeter clear-corneal paracentesis was made at the 1:30 position.  The anterior chamber was filled with Viscoat viscoelastic.  A 2.4 millimeter keratome was used to make a near-clear corneal incision at the 10:30 position.  .  A curvilinear capsulorrhexis was made with a cystotome and capsulorrhexis forceps.  Balanced salt solution was used to hydrodissect and hydrodelineate the nucleus.   Phacoemulsification was then used in stop and chop fashion to remove the lens nucleus and epinucleus.  The remaining cortex was then removed using the irrigation and aspiration handpiece. Provisc was then placed into the capsular bag to distend it for lens placement.  A lens was then injected into the capsular bag.  The remaining viscoelastic was aspirated.   Wounds were hydrated with balanced salt solution.  The anterior chamber was inflated to a physiologic pressure with balanced salt solution.  No wound leaks were noted. Cefuroxime 0.1 ml of  a 10mg /ml solution was injected into the anterior chamber for a dose of 1 mg of intracameral antibiotic at the completion of the case.   Timolol and Brimonidine drops were applied to the eye.  The patient was taken to the recovery room in stable condition without complications of anesthesia or surgery.  Sharon Dickson 09/28/2015, 9:16 AM

## 2015-09-28 NOTE — Anesthesia Procedure Notes (Signed)
Procedure Name: MAC Performed by: Jowana Thumma Pre-anesthesia Checklist: Patient identified, Emergency Drugs available, Suction available, Timeout performed and Patient being monitored Patient Re-evaluated:Patient Re-evaluated prior to inductionOxygen Delivery Method: Nasal cannula Placement Confirmation: positive ETCO2     

## 2015-09-29 ENCOUNTER — Encounter: Payer: Self-pay | Admitting: Ophthalmology

## 2015-09-29 ENCOUNTER — Encounter: Payer: Medicare Other | Admitting: Physical Therapy

## 2015-10-03 ENCOUNTER — Encounter: Payer: Medicare Other | Admitting: Physical Therapy

## 2015-10-06 ENCOUNTER — Encounter: Payer: Medicare Other | Admitting: Physical Therapy

## 2015-10-10 ENCOUNTER — Encounter: Payer: Medicare Other | Admitting: Physical Therapy

## 2015-10-13 ENCOUNTER — Encounter: Payer: Medicare Other | Admitting: Physical Therapy

## 2015-10-14 ENCOUNTER — Encounter: Payer: Self-pay | Admitting: Emergency Medicine

## 2015-10-14 ENCOUNTER — Emergency Department
Admission: EM | Admit: 2015-10-14 | Discharge: 2015-10-14 | Disposition: A | Discharge status: Home / Self Care | Payer: Medicare Other | Attending: Emergency Medicine | Admitting: Emergency Medicine

## 2015-10-14 ENCOUNTER — Emergency Department: Payer: Medicare Other

## 2015-10-14 DIAGNOSIS — E119 Type 2 diabetes mellitus without complications: Secondary | ICD-10-CM | POA: Diagnosis not present

## 2015-10-14 DIAGNOSIS — N189 Chronic kidney disease, unspecified: Secondary | ICD-10-CM | POA: Insufficient documentation

## 2015-10-14 DIAGNOSIS — R55 Syncope and collapse: Secondary | ICD-10-CM | POA: Diagnosis present

## 2015-10-14 DIAGNOSIS — I129 Hypertensive chronic kidney disease with stage 1 through stage 4 chronic kidney disease, or unspecified chronic kidney disease: Secondary | ICD-10-CM | POA: Insufficient documentation

## 2015-10-14 DIAGNOSIS — R42 Dizziness and giddiness: Secondary | ICD-10-CM

## 2015-10-14 DIAGNOSIS — I10 Essential (primary) hypertension: Secondary | ICD-10-CM

## 2015-10-14 LAB — URINALYSIS COMPLETE WITH MICROSCOPIC (ARMC ONLY)
BACTERIA UA: NONE SEEN
Bilirubin Urine: NEGATIVE
Glucose, UA: 50 mg/dL — AB
Hgb urine dipstick: NEGATIVE
KETONES UR: NEGATIVE mg/dL
Leukocytes, UA: NEGATIVE
Nitrite: NEGATIVE
PH: 6 (ref 5.0–8.0)
PROTEIN: 100 mg/dL — AB
Specific Gravity, Urine: 1.009 (ref 1.005–1.030)

## 2015-10-14 LAB — CBC WITH DIFFERENTIAL/PLATELET
BASOS PCT: 1 %
Basophils Absolute: 0.1 10*3/uL (ref 0–0.1)
EOS ABS: 0.3 10*3/uL (ref 0–0.7)
Eosinophils Relative: 4 %
HEMATOCRIT: 33.5 % — AB (ref 35.0–47.0)
HEMOGLOBIN: 11.1 g/dL — AB (ref 12.0–16.0)
LYMPHS ABS: 1.1 10*3/uL (ref 1.0–3.6)
Lymphocytes Relative: 15 %
MCH: 30 pg (ref 26.0–34.0)
MCHC: 33.2 g/dL (ref 32.0–36.0)
MCV: 90.3 fL (ref 80.0–100.0)
Monocytes Absolute: 0.7 10*3/uL (ref 0.2–0.9)
Monocytes Relative: 9 %
NEUTROS ABS: 5 10*3/uL (ref 1.4–6.5)
NEUTROS PCT: 71 %
Platelets: 329 10*3/uL (ref 150–440)
RBC: 3.7 MIL/uL — AB (ref 3.80–5.20)
RDW: 15.2 % — ABNORMAL HIGH (ref 11.5–14.5)
WBC: 7.1 10*3/uL (ref 3.6–11.0)

## 2015-10-14 LAB — COMPREHENSIVE METABOLIC PANEL
ALBUMIN: 2.9 g/dL — AB (ref 3.5–5.0)
ALK PHOS: 54 U/L (ref 38–126)
ALT: 12 U/L — AB (ref 14–54)
AST: 16 U/L (ref 15–41)
Anion gap: 6 (ref 5–15)
BILIRUBIN TOTAL: 0.4 mg/dL (ref 0.3–1.2)
BUN: 18 mg/dL (ref 6–20)
CALCIUM: 7.8 mg/dL — AB (ref 8.9–10.3)
CO2: 19 mmol/L — AB (ref 22–32)
CREATININE: 0.64 mg/dL (ref 0.44–1.00)
Chloride: 110 mmol/L (ref 101–111)
GFR calc Af Amer: >60 mL/min (ref >60)
GFR calc non Af Amer: >60 mL/min (ref >60)
Glucose, Bld: 148 mg/dL — ABNORMAL HIGH (ref 65–99)
Potassium: 3.3 mmol/L — ABNORMAL LOW (ref 3.5–5.1)
SODIUM: 135 mmol/L (ref 135–145)
Total Protein: 6 g/dL — ABNORMAL LOW (ref 6.5–8.1)

## 2015-10-14 LAB — TROPONIN I: Troponin I: <0.03 ng/mL (ref <0.031)

## 2015-10-14 MED ORDER — ONDANSETRON HCL 4 MG/2ML IJ SOLN
4.0000 mg | Freq: Once | INTRAMUSCULAR | Status: AC
  Administered 2015-10-14: 4 mg via INTRAVENOUS
  Filled 2015-10-14: qty 2

## 2015-10-14 MED ORDER — MECLIZINE HCL 25 MG PO TABS: 25.0000 mg | ORAL_TABLET | Freq: Three times a day (TID) | ORAL | Status: DC | PRN

## 2015-10-14 MED ORDER — SODIUM CHLORIDE 0.9 % IV SOLN
1000.0000 mL | Freq: Once | INTRAVENOUS | Status: AC
  Administered 2015-10-14: 1000 mL via INTRAVENOUS

## 2015-10-14 MED ORDER — MECLIZINE HCL 25 MG PO TABS
50.0000 mg | ORAL_TABLET | Freq: Once | ORAL | Status: AC
  Administered 2015-10-14: 50 mg via ORAL
  Filled 2015-10-14: qty 2

## 2015-10-14 MED ORDER — DIAZEPAM 5 MG/ML IJ SOLN
2.5000 mg | Freq: Once | INTRAMUSCULAR | Status: AC
  Administered 2015-10-14: 2.5 mg via INTRAVENOUS
  Filled 2015-10-14: qty 2

## 2015-10-14 MED ORDER — ONDANSETRON 4 MG PO TBDP: 4.0000 mg | ORAL_TABLET | Freq: Three times a day (TID) | ORAL | Status: DC | PRN

## 2015-10-14 NOTE — ED Notes (Signed)
Pt arrived by EMS from home after having "dizziness" today. Per EMS pt has had "several" falls recently and fell last night and hit her head. Since falling last night she has had n/v and continued dizziness.

## 2015-10-14 NOTE — ED Notes (Signed)
Patient transported to CT 

## 2015-10-14 NOTE — ED Provider Notes (Addendum)
[facility] Emergency Department Provider Note     Time seen: ----------------------------------------- 2:54 PM on 10/14/2015 -----------------------------------------    I have reviewed the triage vital signs and the nursing notes.   HISTORY  Chief Complaint No chief complaint on file.    HPI Sharon Dickson is a 82 y.o. female who presents to ER after prolonged dizziness.Patient's been having dizziness for several days and is had some recent falls. She recently fell and hit the back for head, denies loss of consciousness. Patient states she feels very weak, denies any recent illness. Reportedly she has been seen by her primary care doctor for same without any improvement.   Past Medical History  Diagnosis Date  . Cancer ([place])     skin ca  . Chronic kidney disease   . GERD (gastroesophageal reflux disease)   . Bipolar disorder ([place])   . Anxiety   . Hypertension   . Diabetes mellitus without complication ([place])   . Hyperlipidemia   . Proteinuria   . Allergic rhinitis   . Benign essential tremor   . Anemia   . Vitamin B 12 deficiency   . Cognitive impairment   . UTI (urinary tract infection)   . SVT (supraventricular tachycardia) ([place])   . Cystocele   . Cystic disease of breast   . Diverticulosis   . Depression   . Wears dentures     partial lower    Patient Active Problem List   Diagnosis Date Noted  . Melena   . GIB (gastrointestinal bleeding) 04/13/2015  . Anemia 04/13/2015    Past Surgical History  Procedure Laterality Date  . Appendectomy    . Abdominal hysterectomy    . Cholecystectomy    . Bladder surgery  10/2008  . Colonoscopy  11/09/2014  . Colonoscopy with propofol N/A 04/05/2015    Procedure: COLONOSCOPY WITH PROPOFOL;  Surgeon: Lollie Sails, MD;  Location: [facility] ENDOSCOPY;  Service: Endoscopy;  Laterality: N/A;  . Cataract extraction w/phaco Right 08/03/2015    Procedure: CATARACT EXTRACTION PHACO AND  INTRAOCULAR LENS PLACEMENT (I[facility]);  Surgeon: Leandrew Koyanagi, MD;  Location: [place];  Service: Ophthalmology;  Laterality: Right;  DIABETIC - insulin  . Cataract extraction w/phaco Left 09/28/2015    Procedure: CATARACT EXTRACTION PHACO AND INTRAOCULAR LENS PLACEMENT (IOC);  Surgeon: Leandrew Koyanagi, MD;  Location: [place];  Service: Ophthalmology;  Laterality: Left;  DIABETIC LEAVE PT 3RD    Allergies Ceftin; Codeine; Paxil; and Prozac  Social History Social History  Substance Use Topics  . Smoking status: Never Smoker   . Smokeless tobacco: Never Used  . Alcohol Use: No    Review of Systems Constitutional: Negative for fever. Eyes: Negative for visual changes. ENT: Negative for sore throat. Cardiovascular: Negative for chest pain. Respiratory: Negative for shortness of breath. Gastrointestinal: Negative for abdominal pain, vomiting and diarrhea. Genitourinary: Negative for dysuria. Musculoskeletal: Negative for back pain. Skin: Negative for rash. Neurological: Positive for weakness and dizziness  10-point ROS otherwise negative.  ____________________________________________   PHYSICAL EXAM:  VITAL SIGNS: ED Triage Vitals  Enc Vitals Group     BP --      Pulse --      Resp --      Temp --      Temp src --      SpO2 --      Weight --      Height --      Head Cir --  Peak Flow --      Pain Score --      Pain Loc --      Pain Edu? --      Excl. in [place]? --    Constitutional: Alert and oriented. Well appearing and in no distress. Eyes: Conjunctivae are normal. PERRL. Normal extraocular movements. ENT   Head: Contusion is noted in the midline, parieto-occipital scalp   Nose: No congestion/rhinnorhea.   Mouth/Throat: Mucous membranes are moist.   Neck: No stridor. Cardiovascular: Normal rate, regular rhythm. Normal and symmetric distal pulses are present in all extremities. No murmurs, rubs, or gallops. Respiratory:  Normal respiratory effort without tachypnea nor retractions. Breath sounds are clear and equal bilaterally. No wheezes/rales/rhonchi. Gastrointestinal: Soft and nontender. No distention. No abdominal bruits.  Musculoskeletal: Nontender with normal range of motion in all extremities. No joint effusions.  No lower extremity tenderness nor edema. Neurologic:  Normal speech and language. No gross focal neurologic deficits are appreciated.  Skin:  Skin is warm, dry and intact. Erythematous left hand Psychiatric: Mood and affect are normal. Speech and behavior are normal. Patient exhibits appropriate insight and judgment. ____________________________________________  EKG: Interpreted by me. Normal sinus rhythm rate of 74 bpm, normal PR interval, normal QRS, normal QT interval. Normal axis. No evidence of acute infarction.  ____________________________________________  ED COURSE:  Pertinent labs & imaging results that were available during my care of the patient were reviewed by me and considered in my medical decision making (see chart for details). Patient with dizziness, describes vertiginous symptoms. She will receive IV fluids, meclizine and Valium. I will obtain CT imaging as well.  ____________________________________________    LABS (pertinent positives/negatives)  Labs Reviewed  CBC WITH DIFFERENTIAL/PLATELET - Abnormal; Notable for the following:    RBC 3.70 (*)    Hemoglobin 11.1 (*)    HCT 33.5 (*)    RDW 15.2 (*)    All other components within normal limits  COMPREHENSIVE METABOLIC PANEL - Abnormal; Notable for the following:    Potassium 3.3 (*)    CO2 19 (*)    Glucose, Bld 148 (*)    Calcium 7.8 (*)    Total Protein 6.0 (*)    Albumin 2.9 (*)    ALT 12 (*)    All other components within normal limits  URINALYSIS COMPLETEWITH MICROSCOPIC ([facility] ONLY) - Abnormal; Notable for the following:    Color, Urine STRAW (*)    APPearance CLEAR (*)    Glucose, UA 50 (*)     Protein, ur 100 (*)    Squamous Epithelial / LPF 0-5 (*)    All other components within normal limits  TROPONIN I    RADIOLOGY Images were viewed by me  CT head  IMPRESSION: 1. No acute intracranial pathology. 2. Chronic microvascular disease and cerebral atrophy. ____________________________________________  FINAL ASSESSMENT AND PLAN  Vertigo, hypertension  Plan: Patient with labs and imaging as dictated above. Patient is currently feeling better after fluids, meclizine, Valium and antiemetics. She's been able to ambulate to the restroom here without any difficulty. Blood pressure has remained elevated but she has not had her blood pressure medicines yet today. Blood pressures in the 190s. She will take her blood pressure medicine tonight and follow-up with her doctor.   Earleen Newport, MD   Earleen Newport, MD 10/14/15 Jeri Lager  Earleen Newport, MD 10/14/15 [phone]

## 2015-10-14 NOTE — Discharge Instructions (Signed)
Dizziness Dizziness is a common problem. It is a feeling of unsteadiness or light-headedness. You may feel like you are about to faint. Dizziness can lead to injury if you stumble or fall. Anyone can become dizzy, but dizziness is more common in older adults. This condition can be caused by a number of things, including medicines, dehydration, or illness. HOME CARE INSTRUCTIONS Taking these steps may help with your condition: Eating and Drinking  Drink enough fluid to keep your urine clear or pale yellow. This helps to keep you from becoming dehydrated. Try to drink more clear fluids, such as water.  Do not drink alcohol.  Limit your caffeine intake if directed by your health care provider.  Limit your salt intake if directed by your health care provider. Activity  Avoid making quick movements.  Rise slowly from chairs and steady yourself until you feel okay.  In the morning, first sit up on the side of the bed. When you feel okay, stand slowly while you hold onto something until you know that your balance is fine.  Move your legs often if you need to stand in one place for a long time. Tighten and relax your muscles in your legs while you are standing.  Do not drive or operate heavy machinery if you feel dizzy.  Avoid bending down if you feel dizzy. Place items in your home so that they are easy for you to reach without leaning over. Lifestyle  Do not use any tobacco products, including cigarettes, chewing tobacco, or electronic cigarettes. If you need help quitting, ask your health care provider.  Try to reduce your stress level, such as with yoga or meditation. Talk with your health care provider if you need help. General Instructions  Watch your dizziness for any changes.  Take medicines only as directed by your health care provider. Talk with your health care provider if you think that your dizziness is caused by a medicine that you are taking.  Tell a friend or a family  member that you are feeling dizzy. If he or she notices any changes in your behavior, have this person call your health care provider.  Keep all follow-up visits as directed by your health care provider. This is important. SEEK MEDICAL CARE IF:  Your dizziness does not go away.  Your dizziness or light-headedness gets worse.  You feel nauseous.  You have reduced hearing.  You have new symptoms.  You are unsteady on your feet or you feel like the room is spinning. SEEK IMMEDIATE MEDICAL CARE IF:  You vomit or have diarrhea and are unable to eat or drink anything.  You have problems talking, walking, swallowing, or using your arms, hands, or legs.  You feel generally weak.  You are not thinking clearly or you have trouble forming sentences. It may take a friend or family member to notice this.  You have chest pain, abdominal pain, shortness of breath, or sweating.  Your vision changes.  You notice any bleeding.  You have a headache.  You have neck pain or a stiff neck.  You have a fever.   This information is not intended to replace advice given to you by your health care provider. Make sure you discuss any questions you have with your health care provider.   Document Released: 01/02/2001 Document Revised: 11/23/2014 Document Reviewed: 07/05/2014 Hypertension Hypertension is another name for high blood pressure. High blood pressure forces your heart to work harder to pump blood. A blood pressure reading has  two numbers, which includes a higher number over a lower number (example: 110/72). HOME CARE   Have your blood pressure rechecked by your doctor.  Only take medicine as told by your doctor. Follow the directions carefully. The medicine does not work as well if you skip doses. Skipping doses also puts you at risk for problems.  Do not smoke.  Monitor your blood pressure at home as told by your doctor. GET HELP IF:  You think you are having a reaction to the  medicine you are taking.  You have repeat headaches or feel dizzy.  You have puffiness (swelling) in your ankles.  You have trouble with your vision. GET HELP RIGHT AWAY IF:   You get a very bad headache and are confused.  You feel weak, numb, or faint.  You get chest or belly (abdominal) pain.  You throw up (vomit).  You cannot breathe very well. MAKE SURE YOU:   Understand these instructions.  Will watch your condition.  Will get help right away if you are not doing well or get worse.   This information is not intended to replace advice given to you by your health care provider. Make sure you discuss any questions you have with your health care provider.   Document Released: 12/26/2007 Document Revised: 07/14/2013 Document Reviewed: 05/01/2013 Elsevier Interactive Patient Education 2016 [employer].  Chartered certified accountant Patient Education [employer].

## 2015-10-15 ENCOUNTER — Emergency Department: Payer: Medicare Other

## 2015-10-15 ENCOUNTER — Observation Stay
Admission: EM | Admit: 2015-10-15 | Discharge: 2015-10-17 | Disposition: A | Discharge status: Home / Self Care | Payer: Medicare Other | Attending: Internal Medicine | Admitting: Internal Medicine

## 2015-10-15 DIAGNOSIS — F319 Bipolar disorder, unspecified: Secondary | ICD-10-CM | POA: Diagnosis not present

## 2015-10-15 DIAGNOSIS — I471 Supraventricular tachycardia: Secondary | ICD-10-CM | POA: Diagnosis not present

## 2015-10-15 DIAGNOSIS — Z9841 Cataract extraction status, right eye: Secondary | ICD-10-CM | POA: Insufficient documentation

## 2015-10-15 DIAGNOSIS — Z961 Presence of intraocular lens: Secondary | ICD-10-CM | POA: Insufficient documentation

## 2015-10-15 DIAGNOSIS — Z794 Long term (current) use of insulin: Secondary | ICD-10-CM | POA: Insufficient documentation

## 2015-10-15 DIAGNOSIS — R42 Dizziness and giddiness: Secondary | ICD-10-CM | POA: Diagnosis present

## 2015-10-15 DIAGNOSIS — I2510 Atherosclerotic heart disease of native coronary artery without angina pectoris: Secondary | ICD-10-CM | POA: Insufficient documentation

## 2015-10-15 DIAGNOSIS — Z7951 Long term (current) use of inhaled steroids: Secondary | ICD-10-CM | POA: Diagnosis not present

## 2015-10-15 DIAGNOSIS — I63523 Cerebral infarction due to unspecified occlusion or stenosis of bilateral anterior cerebral arteries: Secondary | ICD-10-CM | POA: Diagnosis not present

## 2015-10-15 DIAGNOSIS — Z881 Allergy status to other antibiotic agents status: Secondary | ICD-10-CM | POA: Insufficient documentation

## 2015-10-15 DIAGNOSIS — W19XXXA Unspecified fall, initial encounter: Secondary | ICD-10-CM | POA: Diagnosis not present

## 2015-10-15 DIAGNOSIS — E860 Dehydration: Secondary | ICD-10-CM | POA: Diagnosis not present

## 2015-10-15 DIAGNOSIS — I129 Hypertensive chronic kidney disease with stage 1 through stage 4 chronic kidney disease, or unspecified chronic kidney disease: Secondary | ICD-10-CM | POA: Insufficient documentation

## 2015-10-15 DIAGNOSIS — Z90710 Acquired absence of both cervix and uterus: Secondary | ICD-10-CM | POA: Insufficient documentation

## 2015-10-15 DIAGNOSIS — Z9842 Cataract extraction status, left eye: Secondary | ICD-10-CM | POA: Insufficient documentation

## 2015-10-15 DIAGNOSIS — Z885 Allergy status to narcotic agent status: Secondary | ICD-10-CM | POA: Diagnosis not present

## 2015-10-15 DIAGNOSIS — I63529 Cerebral infarction due to unspecified occlusion or stenosis of unspecified anterior cerebral artery: Secondary | ICD-10-CM

## 2015-10-15 DIAGNOSIS — Z803 Family history of malignant neoplasm of breast: Secondary | ICD-10-CM | POA: Diagnosis not present

## 2015-10-15 DIAGNOSIS — K5790 Diverticulosis of intestine, part unspecified, without perforation or abscess without bleeding: Secondary | ICD-10-CM | POA: Diagnosis not present

## 2015-10-15 DIAGNOSIS — Z79899 Other long term (current) drug therapy: Secondary | ICD-10-CM | POA: Insufficient documentation

## 2015-10-15 DIAGNOSIS — Z87440 Personal history of urinary (tract) infections: Secondary | ICD-10-CM | POA: Insufficient documentation

## 2015-10-15 DIAGNOSIS — Z9889 Other specified postprocedural states: Secondary | ICD-10-CM | POA: Diagnosis not present

## 2015-10-15 DIAGNOSIS — E1122 Type 2 diabetes mellitus with diabetic chronic kidney disease: Secondary | ICD-10-CM | POA: Diagnosis not present

## 2015-10-15 DIAGNOSIS — G250 Essential tremor: Secondary | ICD-10-CM | POA: Insufficient documentation

## 2015-10-15 DIAGNOSIS — D649 Anemia, unspecified: Secondary | ICD-10-CM | POA: Diagnosis not present

## 2015-10-15 DIAGNOSIS — G3183 Dementia with Lewy bodies: Secondary | ICD-10-CM | POA: Insufficient documentation

## 2015-10-15 DIAGNOSIS — Z9049 Acquired absence of other specified parts of digestive tract: Secondary | ICD-10-CM | POA: Insufficient documentation

## 2015-10-15 DIAGNOSIS — E785 Hyperlipidemia, unspecified: Secondary | ICD-10-CM | POA: Diagnosis not present

## 2015-10-15 DIAGNOSIS — K219 Gastro-esophageal reflux disease without esophagitis: Secondary | ICD-10-CM | POA: Insufficient documentation

## 2015-10-15 DIAGNOSIS — F419 Anxiety disorder, unspecified: Secondary | ICD-10-CM | POA: Diagnosis not present

## 2015-10-15 DIAGNOSIS — F0280 Dementia in other diseases classified elsewhere without behavioral disturbance: Secondary | ICD-10-CM | POA: Insufficient documentation

## 2015-10-15 DIAGNOSIS — Z888 Allergy status to other drugs, medicaments and biological substances status: Secondary | ICD-10-CM | POA: Insufficient documentation

## 2015-10-15 DIAGNOSIS — N189 Chronic kidney disease, unspecified: Secondary | ICD-10-CM | POA: Diagnosis not present

## 2015-10-15 DIAGNOSIS — I10 Essential (primary) hypertension: Secondary | ICD-10-CM

## 2015-10-15 DIAGNOSIS — S0003XA Contusion of scalp, initial encounter: Secondary | ICD-10-CM | POA: Diagnosis present

## 2015-10-15 DIAGNOSIS — E538 Deficiency of other specified B group vitamins: Secondary | ICD-10-CM | POA: Insufficient documentation

## 2015-10-15 LAB — BASIC METABOLIC PANEL
Anion gap: 5 (ref 5–15)
BUN: 24 mg/dL — AB (ref 6–20)
CALCIUM: 8.9 mg/dL (ref 8.9–10.3)
CO2: 24 mmol/L (ref 22–32)
CREATININE: 0.89 mg/dL (ref 0.44–1.00)
Chloride: 109 mmol/L (ref 101–111)
GFR calc Af Amer: >60 mL/min (ref >60)
GLUCOSE: 135 mg/dL — AB (ref 65–99)
Potassium: 3.8 mmol/L (ref 3.5–5.1)
SODIUM: 138 mmol/L (ref 135–145)

## 2015-10-15 LAB — URINALYSIS COMPLETE WITH MICROSCOPIC (ARMC ONLY)
BILIRUBIN URINE: NEGATIVE
Bacteria, UA: NONE SEEN
GLUCOSE, UA: NEGATIVE mg/dL
Ketones, ur: NEGATIVE mg/dL
LEUKOCYTES UA: NEGATIVE
NITRITE: NEGATIVE
PH: 5 (ref 5.0–8.0)
Protein, ur: 100 mg/dL — AB
SPECIFIC GRAVITY, URINE: 1.018 (ref 1.005–1.030)

## 2015-10-15 LAB — GLUCOSE, CAPILLARY
GLUCOSE-CAPILLARY: 147 mg/dL — AB (ref 65–99)
GLUCOSE-CAPILLARY: 213 mg/dL — AB (ref 65–99)
Glucose-Capillary: 101 mg/dL — ABNORMAL HIGH (ref 65–99)

## 2015-10-15 LAB — CBC
HCT: 32.2 % — ABNORMAL LOW (ref 35.0–47.0)
Hemoglobin: 10.8 g/dL — ABNORMAL LOW (ref 12.0–16.0)
MCH: 29.9 pg (ref 26.0–34.0)
MCHC: 33.5 g/dL (ref 32.0–36.0)
MCV: 89.4 fL (ref 80.0–100.0)
PLATELETS: 363 10*3/uL (ref 150–440)
RBC: 3.6 MIL/uL — ABNORMAL LOW (ref 3.80–5.20)
RDW: 15 % — AB (ref 11.5–14.5)
WBC: 8.3 10*3/uL (ref 3.6–11.0)

## 2015-10-15 MED ORDER — SODIUM CHLORIDE 0.9 % IV SOLN
INTRAVENOUS | Status: DC
  Administered 2015-10-15 – 2015-10-16 (×2): via INTRAVENOUS

## 2015-10-15 MED ORDER — ATORVASTATIN CALCIUM 20 MG PO TABS
40.0000 mg | ORAL_TABLET | Freq: Every day | ORAL | Status: DC
  Administered 2015-10-16: 40 mg via ORAL
  Filled 2015-10-15: qty 2

## 2015-10-15 MED ORDER — ACETAMINOPHEN 650 MG RE SUPP: 650.0000 mg | Freq: Four times a day (QID) | RECTAL | Status: DC | PRN

## 2015-10-15 MED ORDER — MECLIZINE HCL 25 MG PO TABS
25.0000 mg | ORAL_TABLET | Freq: Three times a day (TID) | ORAL | Status: DC | PRN
  Administered 2015-10-16: 25 mg via ORAL
  Filled 2015-10-15: qty 1

## 2015-10-15 MED ORDER — LORAZEPAM 0.5 MG PO TABS
0.5000 mg | ORAL_TABLET | Freq: Two times a day (BID) | ORAL | Status: DC | PRN
  Administered 2015-10-16 (×2): 0.5 mg via ORAL
  Filled 2015-10-15 (×2): qty 1

## 2015-10-15 MED ORDER — ALBUTEROL SULFATE (2.5 MG/3ML) 0.083% IN NEBU: 2.5000 mg | INHALATION_SOLUTION | RESPIRATORY_TRACT | Status: DC | PRN

## 2015-10-15 MED ORDER — INSULIN ASPART 100 UNIT/ML ~~LOC~~ SOLN
0.0000 [IU] | Freq: Three times a day (TID) | SUBCUTANEOUS | Status: DC
  Administered 2015-10-16 – 2015-10-17 (×2): 2 [IU] via SUBCUTANEOUS
  Filled 2015-10-15 (×2): qty 2

## 2015-10-15 MED ORDER — MECLIZINE HCL 25 MG PO TABS
25.0000 mg | ORAL_TABLET | Freq: Once | ORAL | Status: AC
  Administered 2015-10-15: 25 mg via ORAL
  Filled 2015-10-15: qty 1

## 2015-10-15 MED ORDER — ACETAMINOPHEN 325 MG PO TABS
650.0000 mg | ORAL_TABLET | Freq: Once | ORAL | Status: AC
  Administered 2015-10-15: 650 mg via ORAL
  Filled 2015-10-15: qty 2

## 2015-10-15 MED ORDER — PROPRANOLOL HCL 40 MG PO TABS
40.0000 mg | ORAL_TABLET | Freq: Two times a day (BID) | ORAL | Status: DC
  Administered 2015-10-15 – 2015-10-17 (×4): 40 mg via ORAL
  Filled 2015-10-15 (×4): qty 1

## 2015-10-15 MED ORDER — SODIUM CHLORIDE 0.9% FLUSH
3.0000 mL | Freq: Two times a day (BID) | INTRAVENOUS | Status: DC
  Administered 2015-10-16 – 2015-10-17 (×3): 3 mL via INTRAVENOUS

## 2015-10-15 MED ORDER — INSULIN ASPART 100 UNIT/ML ~~LOC~~ SOLN
0.0000 [IU] | Freq: Every day | SUBCUTANEOUS | Status: DC
  Administered 2015-10-15: 2 [IU] via SUBCUTANEOUS
  Filled 2015-10-15: qty 2

## 2015-10-15 MED ORDER — INSULIN GLARGINE 100 UNIT/ML ~~LOC~~ SOLN
14.0000 [IU] | Freq: Every day | SUBCUTANEOUS | Status: DC
  Administered 2015-10-15 – 2015-10-16 (×2): 14 [IU] via SUBCUTANEOUS
  Filled 2015-10-15 (×3): qty 0.14

## 2015-10-15 MED ORDER — FLUTICASONE PROPIONATE 50 MCG/ACT NA SUSP
2.0000 | Freq: Every day | NASAL | Status: DC | PRN
  Filled 2015-10-15: qty 16

## 2015-10-15 MED ORDER — ACETAMINOPHEN 325 MG PO TABS
650.0000 mg | ORAL_TABLET | Freq: Four times a day (QID) | ORAL | Status: DC | PRN
Start: 2015-10-15 — End: 2015-10-17

## 2015-10-15 MED ORDER — ASPIRIN 81 MG PO CHEW
81.0000 mg | CHEWABLE_TABLET | Freq: Every day | ORAL | Status: DC
  Administered 2015-10-16: 81 mg via ORAL
  Filled 2015-10-15: qty 1

## 2015-10-15 MED ORDER — VENLAFAXINE HCL ER 75 MG PO CP24
75.0000 mg | ORAL_CAPSULE | Freq: Every day | ORAL | Status: DC
  Administered 2015-10-15 – 2015-10-16 (×2): 75 mg via ORAL
  Filled 2015-10-15: qty 1

## 2015-10-15 MED ORDER — ENOXAPARIN SODIUM 40 MG/0.4ML ~~LOC~~ SOLN
40.0000 mg | SUBCUTANEOUS | Status: DC
  Administered 2015-10-15 – 2015-10-16 (×2): 40 mg via SUBCUTANEOUS
  Filled 2015-10-15 (×2): qty 0.4

## 2015-10-15 MED ORDER — VENLAFAXINE HCL ER 75 MG PO CP24
150.0000 mg | ORAL_CAPSULE | Freq: Every day | ORAL | Status: DC
  Administered 2015-10-16 – 2015-10-17 (×2): 150 mg via ORAL
  Filled 2015-10-15 (×3): qty 2

## 2015-10-15 MED ORDER — TETANUS-DIPHTH-ACELL PERTUSSIS 5-2.5-18.5 LF-MCG/0.5 IM SUSP
0.5000 mL | Freq: Once | INTRAMUSCULAR | Status: AC
  Administered 2015-10-15: 0.5 mL via INTRAMUSCULAR
  Filled 2015-10-15: qty 0.5

## 2015-10-15 MED ORDER — PRIMIDONE 50 MG PO TABS
25.0000 mg | ORAL_TABLET | Freq: Two times a day (BID) | ORAL | Status: DC
  Administered 2015-10-15 – 2015-10-17 (×4): 25 mg via ORAL
  Filled 2015-10-15 (×4): qty 1

## 2015-10-15 MED ORDER — ASPIRIN EC 325 MG PO TBEC
325.0000 mg | DELAYED_RELEASE_TABLET | Freq: Once | ORAL | Status: AC
  Administered 2015-10-15: 325 mg via ORAL
  Filled 2015-10-15: qty 1

## 2015-10-15 MED ORDER — ONDANSETRON HCL 4 MG PO TABS: 4.0000 mg | ORAL_TABLET | Freq: Four times a day (QID) | ORAL | Status: DC | PRN

## 2015-10-15 MED ORDER — LOSARTAN POTASSIUM 50 MG PO TABS
50.0000 mg | ORAL_TABLET | Freq: Every day | ORAL | Status: DC
  Administered 2015-10-15 – 2015-10-16 (×2): 50 mg via ORAL
  Filled 2015-10-15 (×2): qty 1

## 2015-10-15 MED ORDER — VENLAFAXINE HCL ER 75 MG PO CP24: 75.0000 mg | ORAL_CAPSULE | ORAL | Status: DC

## 2015-10-15 MED ORDER — PANTOPRAZOLE SODIUM 40 MG PO TBEC
40.0000 mg | DELAYED_RELEASE_TABLET | Freq: Every day | ORAL | Status: DC
  Administered 2015-10-15 – 2015-10-17 (×3): 40 mg via ORAL
  Filled 2015-10-15 (×3): qty 1

## 2015-10-15 MED ORDER — ONDANSETRON HCL 4 MG/2ML IJ SOLN: 4.0000 mg | Freq: Four times a day (QID) | INTRAMUSCULAR | Status: DC | PRN

## 2015-10-15 MED ORDER — HYDRALAZINE HCL 20 MG/ML IJ SOLN
10.0000 mg | Freq: Four times a day (QID) | INTRAMUSCULAR | Status: DC | PRN
  Administered 2015-10-15: 10 mg via INTRAVENOUS
  Filled 2015-10-15: qty 1

## 2015-10-15 MED ORDER — IOPAMIDOL (ISOVUE-370) INJECTION 76%
75.0000 mL | Freq: Once | INTRAVENOUS | Status: AC | PRN
  Administered 2015-10-15: 75 mL via INTRAVENOUS

## 2015-10-15 NOTE — ED Notes (Signed)
Pt came to ED via EMS c/o fall. Pt fell this morning while walking to the bathroom. Pt reports feeling dizzy before falling. Pt reports loc. Pt reports she fell last night as well. Pt came to ED last night for fall and was sent home. Pt has bleeding to back of head.

## 2015-10-15 NOTE — ED Notes (Signed)
Pt transported to CT ?

## 2015-10-15 NOTE — H&P (Signed)
[facility] NAME: Sharon Dickson    MR#:  [id number]  DATE OF BIRTH:  [birth year]  DATE OF ADMISSION:  10/15/2015  PRIMARY CARE PHYSICIAN: Ezequiel Kayser, MD   REQUESTING/REFERRING PHYSICIAN: Eula Listen, MD  CHIEF COMPLAINT:   Chief Complaint  Patient presents with  . Fall   Dizziness and fall 3 times. HISTORY OF PRESENT ILLNESS:  Sharon Dickson  is a 82 y.o. female with a known history of Hypertension, diabetes and hyperlipidemia. The patient has had the dizziness for a Eckmann days. She had been spinning sensation worse with positional changes. She also has some generalized weakness. He came to the ED yesterday with a CAT scan which didn't show any acute process. Patient has still has dizziness and fell again today. She hated her head on the past and there was bleeding from her posterior scalp. She denies any loss of consciousness or seizure. Denies any slurred speech or dysphagia, numbness or tingling. CT angiogram of the head show Bilateral ACA stenosis. Dr. Mariea Clonts request admitting the patient.  PAST MEDICAL HISTORY:   Past Medical History  Diagnosis Date  . Cancer ([place])     skin ca  . Chronic kidney disease   . GERD (gastroesophageal reflux disease)   . Bipolar disorder ([place])   . Anxiety   . Hypertension   . Diabetes mellitus without complication ([place])   . Hyperlipidemia   . Proteinuria   . Allergic rhinitis   . Benign essential tremor   . Anemia   . Vitamin B 12 deficiency   . Cognitive impairment   . UTI (urinary tract infection)   . SVT (supraventricular tachycardia) ([place])   . Cystocele   . Cystic disease of breast   . Diverticulosis   . Depression   . Wears dentures     partial lower    PAST SURGICAL HISTORY:   Past Surgical History  Procedure Laterality Date  . Appendectomy    . Abdominal hysterectomy    . Cholecystectomy    . Bladder surgery  10/2008  . Colonoscopy  11/09/2014  . Colonoscopy  with propofol N/A 04/05/2015    Procedure: COLONOSCOPY WITH PROPOFOL;  Surgeon: Lollie Sails, MD;  Location: [facility] ENDOSCOPY;  Service: Endoscopy;  Laterality: N/A;  . Cataract extraction w/phaco Right 08/03/2015    Procedure: CATARACT EXTRACTION PHACO AND INTRAOCULAR LENS PLACEMENT (IOC);  Surgeon: Leandrew Koyanagi, MD;  Location: [place] [facility];  Service: Ophthalmology;  Laterality: Right;  DIABETIC - insulin  . Cataract extraction w/phaco Left 09/28/2015    Procedure: CATARACT EXTRACTION PHACO AND INTRAOCULAR LENS PLACEMENT (IOC);  Surgeon: Leandrew Koyanagi, MD;  Location: [place];  Service: Ophthalmology;  Laterality: Left;  DIABETIC LEAVE PT 3RD    SOCIAL HISTORY:   Social History  Substance Use Topics  . Smoking status: Never Smoker   . Smokeless tobacco: Never Used  . Alcohol Use: No    FAMILY HISTORY:   Family History  Problem Relation Age of Onset  . Breast cancer Daughter 54    DRUG ALLERGIES:   Allergies  Allergen Reactions  . Ceftin [Cefuroxime Axetil] Nausea And Vomiting  . Codeine Nausea And Vomiting  . Metformin Diarrhea  . Paxil [Paroxetine Hcl] Other (See Comments)    Reaction:  Unknown   . Prozac [Fluoxetine Hcl] Other (See Comments)    Reaction:  Dizziness     REVIEW OF SYSTEMS:  CONSTITUTIONAL: No fever, has dizziness and weakness.  EYES: No blurred or double vision.  EARS, NOSE, AND THROAT: No tinnitus or ear pain.  RESPIRATORY: No cough, shortness of breath, wheezing or hemoptysis.  CARDIOVASCULAR: No chest pain, orthopnea, edema.  GASTROINTESTINAL: No nausea, vomiting, diarrhea or abdominal pain.  GENITOURINARY: No dysuria, hematuria.  ENDOCRINE: No polyuria, nocturia,  HEMATOLOGY: No anemia, easy bruising or bleeding SKIN: No rash or lesion. MUSCULOSKELETAL: No joint pain or arthritis.   NEUROLOGIC: No tingling, numbness, weakness.  PSYCHIATRY: No anxiety or depression.   MEDICATIONS AT HOME:   Prior to Admission  medications   Medication Sig Start Date End Date Taking? Authorizing Provider  Biotin 1 MG CAPS Take 1 mg by mouth daily.    Yes Historical Provider, MD  cholecalciferol (VITAMIN D) 1000 UNITS tablet Take 1,000 Units by mouth daily.   Yes Historical Provider, MD  fluticasone (FLONASE) 50 MCG/ACT nasal spray Place 2 sprays into both nostrils daily as needed for rhinitis.    Yes Historical Provider, MD  insulin aspart protamine- aspart (NOVOLOG MIX 70/30) (70-30) 100 UNIT/ML injection Inject 14 Units into the skin daily with supper.   Yes Historical Provider, MD  insulin glargine (LANTUS) 100 UNIT/ML injection Inject 14 Units into the skin at bedtime.   Yes Historical Provider, MD  losartan (COZAAR) 50 MG tablet Take 50 mg by mouth daily.   Yes Historical Provider, MD  meclizine (ANTIVERT) 25 MG tablet Take 1 tablet (25 mg total) by mouth 3 (three) times daily as needed for dizziness or nausea. 10/14/15  Yes Earleen Newport, MD  multivitamin-iron-minerals-folic acid (CENTRUM) chewable tablet Chew 1 tablet by mouth daily.   Yes Historical Provider, MD  omeprazole (PRILOSEC) 20 MG capsule Take 20 mg by mouth at bedtime.    Yes Historical Provider, MD  ondansetron (ZOFRAN ODT) 4 MG disintegrating tablet Take 1 tablet (4 mg total) by mouth every 8 (eight) hours as needed for nausea or vomiting. 10/14/15  Yes Earleen Newport, MD  primidone (MYSOLINE) 50 MG tablet Take 25 mg by mouth 2 (two) times daily.    Yes Historical Provider, MD  propranolol (INDERAL) 40 MG tablet Take 40 mg by mouth 2 (two) times daily.   Yes Historical Provider, MD  venlafaxine XR (EFFEXOR-XR) 75 MG 24 hr capsule Take 75-150 mg by mouth See admin instructions. Take 2 capsules (150mg ) by mouth in the morning and Take 1 capsule by mouth every night at bedtime.   Yes Historical Provider, MD  vitamin E 400 UNIT capsule Take 400 Units by mouth daily.   Yes Historical Provider, MD  metFORMIN (GLUCOPHAGE-XR) 500 MG 24 hr tablet Take  500 mg by mouth 2 (two) times daily. Pt takes two in the morning and one in the evening.    Historical Provider, MD      VITAL SIGNS:  Blood pressure 175/79, pulse 75, resp. rate 18, SpO2 96 %.  PHYSICAL EXAMINATION:  GENERAL:  82 y.o.-year-old patient lying in the bed with no acute distress. Thin. EYES: Pupils equal, round, reactive to light and accommodation. No scleral icterus. Extraocular muscles intact.  HEENT: Head atraumatic, normocephalic. Oropharynx and nasopharynx clear. Moist oral mucosa. NECK:  Supple, no jugular venous distention. No thyroid enlargement, no tenderness.  LUNGS: Normal breath sounds bilaterally, no wheezing, rales,rhonchi or crepitation. No use of accessory muscles of respiration.  CARDIOVASCULAR: S1, S2 normal. No murmurs, rubs, or gallops.  ABDOMEN: Soft, nontender, nondistended. Bowel sounds present. No organomegaly or mass.  EXTREMITIES: No pedal edema, cyanosis, or clubbing.  NEUROLOGIC: Cranial nerves II through XII are intact. Muscle strength 4/5 in all extremities. Sensation intact. Gait not checked.  PSYCHIATRIC: The patient is alert and oriented x 3.  SKIN: No obvious rash, lesion, or ulcer.   LABORATORY PANEL:   CBC  Recent Labs Lab 10/15/15 1200  WBC 8.3  HGB 10.8*  HCT 32.2*  PLT 363   ------------------------------------------------------------------------------------------------------------------  Chemistries   Recent Labs Lab 10/14/15 1610 10/15/15 1200  NA 135 138  K 3.3* 3.8  CL 110 109  CO2 19* 24  GLUCOSE 148* 135*  BUN 18 24*  CREATININE 0.64 0.89  CALCIUM 7.8* 8.9  AST 16  --   ALT 12*  --   ALKPHOS 54  --   BILITOT 0.4  --    ------------------------------------------------------------------------------------------------------------------  Cardiac Enzymes  Recent Labs Lab 10/14/15 1610  TROPONINI <0.03    ------------------------------------------------------------------------------------------------------------------  RADIOLOGY:  Ct Angio Head W/cm &/or Wo Cm  10/15/2015  ADDENDUM REPORT: 10/15/2015 13:56 ADDENDUM: Study discussed by telephone with Dr. Eula Listen on 10/15/2015 At 1351 hours. We reviewed the severe anterior cerebral artery disease. Also I advised that the tiny left ICA lesion described in #4 is most likely an inconsequential infundibulum. Electronically Signed   By: Genevie Ann M.D.   On: 10/15/2015 13:56  10/15/2015  CLINICAL DATA:  82 year old female with dizziness and fall this morning. Initial encounter. EXAM: CT ANGIOGRAPHY HEAD AND NECK TECHNIQUE: Multidetector CT imaging of the head and neck was performed using the standard protocol during bolus administration of intravenous contrast. Multiplanar CT image reconstructions and MIPs were obtained to evaluate the vascular anatomy. Carotid stenosis measurements (when applicable) are obtained utilizing NASCET criteria, using the distal internal carotid diameter as the denominator. CONTRAST:  75 mL Isovue 370 COMPARISON:  Head CT without contrast 10/14/2015, and earlier. FINDINGS: CT HEAD Brain: No midline shift, mass effect, or evidence of intracranial mass lesion. Stable ventricle size and configuration. No acute intracranial hemorrhage identified. Gray-white matter differentiation appears stable. Evidence of chronic deep gray and white matter lacunar infarcts, as well is a small chronic cortically based infarct of the right cingulate gyrus (series 2, image 12). Calvarium and skull base: Calvarium intact. Paranasal sinuses: Visualized paranasal sinuses and mastoids are clear. Orbits: No scalp hematoma identified. No acute orbits soft tissue findings. CTA NECK Skeleton: Widespread cervical spine degeneration including multilevel spondylolisthesis. Widespread cervical facet hypertrophy. Bilateral posterior element alignment is within  normal limits. Cervicothoracic junction alignment is within normal limits. No acute osseous abnormality identified. Other neck: Negative visualized lung parenchyma. No superior mediastinal lymphadenopathy. Negative thyroid, sub cm hypodense nodule on the right does not meet consensus criteria for ultrasound follow-up. Larynx, pharynx (motion artifact), parapharyngeal spaces, retropharyngeal space, sublingual space, submandibular glands, and parotid glands are within normal limits. No cervical lymphadenopathy. Aortic arch: 3 vessel arch configuration. Moderate soft and calcified arch atherosclerosis. Right carotid system: No brachiocephalic or right CCA origin stenosis. Tortuous proximal right CCA. Widely patent right carotid bifurcation with no significant atherosclerosis. Motion artifact at the distal right ICA bulb. Otherwise negative cervical right ICA. Left carotid system: Calcified plaque at the left CCA origin but no stenosis. Tortuous left CCA with a partially retropharyngeal course. Minimal plaque at the left carotid bifurcation but more substantial calcified plaque in the distal posterior left ICA bulb. Still, stenosis appears less than 50 % with respect to the distal vessel. Motion artifact just distal to this plaque. Tortuous cervical left ICA with a kinked appearance at the C1 level,  and again just below the skullbase. Vertebral arteries:No proximal right subclavian artery stenosis despite calcified plaque. Normal right vertebral artery origin. Non dominant right vertebral artery is intermittently tortuous and otherwise negative to the skullbase. Calcified plaque in the proximal left subclavian artery without significant stenosis. Calcified plaque at the left vertebral artery origin without significant stenosis. Dominant left vertebral artery is tortuous in the V1 segment. Additional proximal the 2 calcified plaque without stenosis. Tortuous V2 segment. No significant stenosis to the skullbase. CTA HEAD  Posterior circulation: Dominant distal left vertebral artery. No distal vertebral artery stenosis common the non dominant right vertebral functionally terminates in PICA. Normal PICA origins. No basilar artery stenosis. Normal SCA and right PCA origins. Fetal type left PCA origin. Diminutive right posterior communicating artery. Left PCA branches are within normal limits. There is mild to moderate stenosis in the right PCA P2 segment best seen on series 13 image 13. Otherwise negative right PCA branches. Anterior circulation: Both ICA siphons are patent with mild tortuosity. Minimal left calcified plaque. More calcified plaque on the right, most pronounced in the supraclinoid segment, but no hemodynamically significant stenosis results. Normal left posterior communicating artery origin, the proximal to that there is a small left ICA superior high apophyseal infundibulum or aneurysm measuring 1-2 mm (series 12, image 78). Patent carotid termini. Normal MCA and ACA origins. Dominant right ACA A1 segment . Extensive ACA atherosclerosis and stenosis. Occluded left ACA A2 segment with distal reconstituted flow, but continued high-grade irregularity and multifocal high-grade distal left ACA stenoses. Irregularity with a high-grade stenosis in the azygos A2 segment (series 13, image 14) with preserved distal flow, but then additional moderate irregularity in the right ACA branches. Bilateral MCA M1 segments, bifurcations, and MCA branches are normal aside from mild bilateral in 3 segment irregularity. Venous sinuses: Patent. Anatomic variants: Dominant left vertebral artery. Fetal type left PCA origin. Dominant right ACA A1 segment with azygos type A2 anatomy. Delayed phase: Mild venous related enhancement suspected on series 15, image 20 in the right ACA subcortical white matter. No definite abnormal intracranial enhancement. IMPRESSION: 1. No significant arterial stenosis in the neck. Tortuous carotid arteries with  intermittent calcified plaque but no significant carotid artery stenosis. 2. Negative for emergent large vessel occlusion, but positive for segmental occlusion of the left ACA A2 segment. Also High-grade stenosis in the right ACA A2 segment with preserved distal flow. 3. Lesser posterior circulation atherosclerosis, with mild to moderate right PCA P2 segment stenosis. 4. 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 5. No acute cortically based infarct identified by CT. No intracranial hemorrhage. 6. Chronic basal ganglia, cerebral white matter, and right ACA territory ischemia. Electronically Signed: By: Genevie Ann M.D. On: 10/15/2015 13:47   Ct Head Wo Contrast  10/14/2015  CLINICAL DATA:  Dizziness, fall, head injury EXAM: CT HEAD WITHOUT CONTRAST TECHNIQUE: Contiguous axial images were obtained from the base of the skull through the vertex without intravenous contrast. COMPARISON:  02/18/2015 FINDINGS: There is no evidence of mass effect, midline shift, or extra-axial fluid collections. There is no evidence of a space-occupying lesion or intracranial hemorrhage. There is no evidence of a cortical-based area of acute infarction. There is generalized cerebral atrophy. There is periventricular white matter low attenuation likely secondary to microangiopathy. The ventricles and sulci are appropriate for the patient's age. The basal cisterns are patent. Visualized portions of the orbits are unremarkable. The visualized portions of the paranasal sinuses and mastoid air cells are unremarkable. Cerebrovascular atherosclerotic calcifications  are noted. The osseous structures are unremarkable. IMPRESSION: 1. No acute intracranial pathology. 2. Chronic microvascular disease and cerebral atrophy. Electronically Signed   By: Kathreen Devoid   On: 10/14/2015 15:13   Ct Angio Neck W/cm &/or Wo/cm  10/15/2015  ADDENDUM REPORT: 10/15/2015 13:56 ADDENDUM: Study discussed by telephone with Dr. Eula Listen on  10/15/2015 At 1351 hours. We reviewed the severe anterior cerebral artery disease. Also I advised that the tiny left ICA lesion described in #4 is most likely an inconsequential infundibulum. Electronically Signed   By: Genevie Ann M.D.   On: 10/15/2015 13:56  10/15/2015  CLINICAL DATA:  82 year old female with dizziness and fall this morning. Initial encounter. EXAM: CT ANGIOGRAPHY HEAD AND NECK TECHNIQUE: Multidetector CT imaging of the head and neck was performed using the standard protocol during bolus administration of intravenous contrast. Multiplanar CT image reconstructions and MIPs were obtained to evaluate the vascular anatomy. Carotid stenosis measurements (when applicable) are obtained utilizing NASCET criteria, using the distal internal carotid diameter as the denominator. CONTRAST:  75 mL Isovue 370 COMPARISON:  Head CT without contrast 10/14/2015, and earlier. FINDINGS: CT HEAD Brain: No midline shift, mass effect, or evidence of intracranial mass lesion. Stable ventricle size and configuration. No acute intracranial hemorrhage identified. Gray-white matter differentiation appears stable. Evidence of chronic deep gray and white matter lacunar infarcts, as well is a small chronic cortically based infarct of the right cingulate gyrus (series 2, image 12). Calvarium and skull base: Calvarium intact. Paranasal sinuses: Visualized paranasal sinuses and mastoids are clear. Orbits: No scalp hematoma identified. No acute orbits soft tissue findings. CTA NECK Skeleton: Widespread cervical spine degeneration including multilevel spondylolisthesis. Widespread cervical facet hypertrophy. Bilateral posterior element alignment is within normal limits. Cervicothoracic junction alignment is within normal limits. No acute osseous abnormality identified. Other neck: Negative visualized lung parenchyma. No superior mediastinal lymphadenopathy. Negative thyroid, sub cm hypodense nodule on the right does not meet consensus  criteria for ultrasound follow-up. Larynx, pharynx (motion artifact), parapharyngeal spaces, retropharyngeal space, sublingual space, submandibular glands, and parotid glands are within normal limits. No cervical lymphadenopathy. Aortic arch: 3 vessel arch configuration. Moderate soft and calcified arch atherosclerosis. Right carotid system: No brachiocephalic or right CCA origin stenosis. Tortuous proximal right CCA. Widely patent right carotid bifurcation with no significant atherosclerosis. Motion artifact at the distal right ICA bulb. Otherwise negative cervical right ICA. Left carotid system: Calcified plaque at the left CCA origin but no stenosis. Tortuous left CCA with a partially retropharyngeal course. Minimal plaque at the left carotid bifurcation but more substantial calcified plaque in the distal posterior left ICA bulb. Still, stenosis appears less than 50 % with respect to the distal vessel. Motion artifact just distal to this plaque. Tortuous cervical left ICA with a kinked appearance at the C1 level, and again just below the skullbase. Vertebral arteries:No proximal right subclavian artery stenosis despite calcified plaque. Normal right vertebral artery origin. Non dominant right vertebral artery is intermittently tortuous and otherwise negative to the skullbase. Calcified plaque in the proximal left subclavian artery without significant stenosis. Calcified plaque at the left vertebral artery origin without significant stenosis. Dominant left vertebral artery is tortuous in the V1 segment. Additional proximal the 2 calcified plaque without stenosis. Tortuous V2 segment. No significant stenosis to the skullbase. CTA HEAD Posterior circulation: Dominant distal left vertebral artery. No distal vertebral artery stenosis common the non dominant right vertebral functionally terminates in PICA. Normal PICA origins. No basilar artery stenosis. Normal SCA and right  PCA origins. Fetal type left PCA origin.  Diminutive right posterior communicating artery. Left PCA branches are within normal limits. There is mild to moderate stenosis in the right PCA P2 segment best seen on series 13 image 13. Otherwise negative right PCA branches. Anterior circulation: Both ICA siphons are patent with mild tortuosity. Minimal left calcified plaque. More calcified plaque on the right, most pronounced in the supraclinoid segment, but no hemodynamically significant stenosis results. Normal left posterior communicating artery origin, the proximal to that there is a small left ICA superior high apophyseal infundibulum or aneurysm measuring 1-2 mm (series 12, image 78). Patent carotid termini. Normal MCA and ACA origins. Dominant right ACA A1 segment . Extensive ACA atherosclerosis and stenosis. Occluded left ACA A2 segment with distal reconstituted flow, but continued high-grade irregularity and multifocal high-grade distal left ACA stenoses. Irregularity with a high-grade stenosis in the azygos A2 segment (series 13, image 14) with preserved distal flow, but then additional moderate irregularity in the right ACA branches. Bilateral MCA M1 segments, bifurcations, and MCA branches are normal aside from mild bilateral in 3 segment irregularity. Venous sinuses: Patent. Anatomic variants: Dominant left vertebral artery. Fetal type left PCA origin. Dominant right ACA A1 segment with azygos type A2 anatomy. Delayed phase: Mild venous related enhancement suspected on series 15, image 20 in the right ACA subcortical white matter. No definite abnormal intracranial enhancement. IMPRESSION: 1. No significant arterial stenosis in the neck. Tortuous carotid arteries with intermittent calcified plaque but no significant carotid artery stenosis. 2. Negative for emergent large vessel occlusion, but positive for segmental occlusion of the left ACA A2 segment. Also High-grade stenosis in the right ACA A2 segment with preserved distal flow. 3. Lesser  posterior circulation atherosclerosis, with mild to moderate right PCA P2 segment stenosis. 4. 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 5. No acute cortically based infarct identified by CT. No intracranial hemorrhage. 6. Chronic basal ganglia, cerebral white matter, and right ACA territory ischemia. Electronically Signed: By: Genevie Ann M.D. On: 10/15/2015 13:47    EKG:   Orders placed or performed during the hospital encounter of 10/14/15  . ED EKG  . ED EKG  . EKG 12-Lead  . EKG 12-Lead    IMPRESSION AND PLAN:   The patient will be placed for observation.  Dizziness Continue Antivert, fall and aspiration precaution. Physical therapy.  Bilateral ACA stenosis, with the right side high-grade stenosis. Neurology consult. Continue aspirin and start Lipitor.  Dehydration. Start IV fluid support and follow-up BMP.  Accelerated hypertension. Continue hypertension with hydralazine IV when necessary.  Diabetes. Start sliding scale. Continue Lantus.   All the records are reviewed and case discussed with ED provider. Management plans discussed with the patient, Her husband and they are in agreement.  CODE STATUS: Full code  TOTAL TIME TAKING CARE OF THIS PATIENT: 52 minutes.    Demetrios Loll M.D on 10/15/2015 at 5:19 PM  Between 7am to 6pm - Pager - [phone]  After 6pm go to [email or web address] - password [email or web address] [facility]  Office  [phone]  CC: Primary care physician; Ezequiel Kayser, MD

## 2015-10-15 NOTE — ED Notes (Signed)
1A called to say pt will be admitted to 1C instead. Waiting on bed to be ready to bring patient to floor.

## 2015-10-15 NOTE — Care Management Obs Status (Signed)
[facility] NOTIFICATION   Patient Details  Name: Sherral Kramar MRN: [id number] Date of Birth: 1933/08/24   Medicare Observation Status Notification Given:  Yes (spouse signed / dizziness)    Ival Bible, RN 10/15/2015, 5:52 PM

## 2015-10-15 NOTE — ED Provider Notes (Addendum)
[facility] - Inpatient Emergency Department Provider Note  ____________________________________________  Time seen: Approximately 11:58 AM  I have reviewed the triage vital signs and the nursing notes.   HISTORY  Chief Complaint Fall    HPI Sharon Dickson is a 82 y.o. female history of hypertension, anemia, cognitive impairment and a benign essential tumor presenting with dizziness and fall with head injury. For the past several days, the patient has had a room spinning sensation that is worse with positional changes. She has also had some generalized weakness per she was evaluated yesterday with a CT scan that showed no acute process and overall reassuring labs. Felt significantly better after meclizine and Valium and was discharged home. This morning she woke up and was ambulating to go to the bathroom when she had sudden onset room spinning sensation and lost her balance, falling and hitting her head on the bathtub. She noted that there was bleeding on her posterior scalp. She denies any loss of consciousness. No visual changes including blurred or double vision. No speech changes, mentation changes, severe headache, numbness tingling or weakness.  No associated chest pain, shortness of breath or palpitations.   Past Medical History  Diagnosis Date  . Cancer ([place])     skin ca  . Chronic kidney disease   . GERD (gastroesophageal reflux disease)   . Bipolar disorder ([place])   . Anxiety   . Hypertension   . Diabetes mellitus without complication ([place])   . Hyperlipidemia   . Proteinuria   . Allergic rhinitis   . Benign essential tremor   . Anemia   . Vitamin B 12 deficiency   . Cognitive impairment   . UTI (urinary tract infection)   . SVT (supraventricular tachycardia) ([place])   . Cystocele   . Cystic disease of breast   . Diverticulosis   . Depression   . Wears dentures     partial lower    Patient Active Problem List   Diagnosis Date Noted  . Melena    . GIB (gastrointestinal bleeding) 04/13/2015  . Anemia 04/13/2015    Past Surgical History  Procedure Laterality Date  . Appendectomy    . Abdominal hysterectomy    . Cholecystectomy    . Bladder surgery  10/2008  . Colonoscopy  11/09/2014  . Colonoscopy with propofol N/A 04/05/2015    Procedure: COLONOSCOPY WITH PROPOFOL;  Surgeon: Lollie Sails, MD;  Location: [facility] ENDOSCOPY;  Service: Endoscopy;  Laterality: N/A;  . Cataract extraction w/phaco Right 08/03/2015    Procedure: CATARACT EXTRACTION PHACO AND INTRAOCULAR LENS PLACEMENT (IOC);  Surgeon: Leandrew Koyanagi, MD;  Location: [place];  Service: Ophthalmology;  Laterality: Right;  DIABETIC - insulin  . Cataract extraction w/phaco Left 09/28/2015    Procedure: CATARACT EXTRACTION PHACO AND INTRAOCULAR LENS PLACEMENT (IOC);  Surgeon: Leandrew Koyanagi, MD;  Location: [place];  Service: Ophthalmology;  Laterality: Left;  DIABETIC LEAVE PT 3RD    Current Outpatient Rx  Name  Route  Sig  Dispense  Refill  . Biotin 1 MG CAPS   Oral   Take 1 mg by mouth daily.          . cholecalciferol (VITAMIN D) 1000 UNITS tablet   Oral   Take 1,000 Units by mouth daily.         . fluticasone (FLONASE) 50 MCG/ACT nasal spray   Each Nare   Place 2 sprays into both nostrils daily as needed for rhinitis.          Marland Kitchen  insulin aspart protamine- aspart (NOVOLOG MIX 70/30) (70-30) 100 UNIT/ML injection   Subcutaneous   Inject 14 Units into the skin daily with supper.         . insulin glargine (LANTUS) 100 UNIT/ML injection   Subcutaneous   Inject 14 Units into the skin at bedtime.         Marland Kitchen losartan (COZAAR) 50 MG tablet   Oral   Take 50 mg by mouth daily.         . meclizine (ANTIVERT) 25 MG tablet   Oral   Take 1 tablet (25 mg total) by mouth 3 (three) times daily as needed for dizziness or nausea.   30 tablet   1   . metFORMIN (GLUCOPHAGE-XR) 500 MG 24 hr tablet   Oral   Take 500 mg by mouth 2  (two) times daily. Pt takes two in the morning and one in the evening.         . multivitamin-iron-minerals-folic acid (CENTRUM) chewable tablet   Oral   Chew 1 tablet by mouth daily.         Marland Kitchen omeprazole (PRILOSEC) 20 MG capsule   Oral   Take 20 mg by mouth at bedtime.          . ondansetron (ZOFRAN ODT) 4 MG disintegrating tablet   Oral   Take 1 tablet (4 mg total) by mouth every 8 (eight) hours as needed for nausea or vomiting.   20 tablet   0   . primidone (MYSOLINE) 50 MG tablet   Oral   Take 25 mg by mouth 2 (two) times daily.          . propranolol (INDERAL) 40 MG tablet   Oral   Take 40 mg by mouth 2 (two) times daily.         Marland Kitchen venlafaxine XR (EFFEXOR-XR) 75 MG 24 hr capsule   Oral   Take 75-150 mg by mouth 2 (two) times daily. Pt takes two in the morning and one at night.         . vitamin E 400 UNIT capsule   Oral   Take 400 Units by mouth daily.           Allergies Ceftin; Codeine; Paxil; and Prozac  Family History  Problem Relation Age of Onset  . Breast cancer Daughter 62    Social History Social History  Substance Use Topics  . Smoking status: Never Smoker   . Smokeless tobacco: Never Used  . Alcohol Use: No    Review of Systems Constitutional: No fever/chills.Positive fall. No lightheadedness or syncope. No loss of consciousness. Eyes: No visual changes. No blurred or double vision. ENT: No sore throat. Cardiovascular: Denies chest pain, palpitations. Respiratory: Denies shortness of breath.  No cough. Gastrointestinal: No abdominal pain.  No nausea, no vomiting.  No diarrhea.  No constipation. Genitourinary: Negative for dysuria. Musculoskeletal: Negative for back pain. No neck or back pain. No pain in the hips or extremities. Skin: Negative for rash. Neurological: Negative for headaches, focal weakness or numbness. No tingling. No changes in vision or speech. No changes in mentation. Positive dizziness.  10-point ROS  otherwise negative.  ____________________________________________   PHYSICAL EXAM:  VITAL SIGNS: ED Triage Vitals  Enc Vitals Group     BP --      Pulse --      Resp --      Temp --      Temp src --  SpO2 --      Weight --      Height --      Head Cir --      Peak Flow --      Pain Score --      Pain Loc --      Pain Edu? --      Excl. in [place]? --     Constitutional: Patient is alert and answering questions appropriately. GCS is 15. She is in no acute distress.  Eyes: Conjunctivae are normal.  EOMI. PERRLA. No evidence of raccoon eyes. Head: Patient has diffuse blood with no active bleeding on the posterior scalp. Her hair is matted down and despite multiple attempts, I am unable to find any laceration. We will try to remove some of the blood in her hair which may reveal an abrasion or laceration is the cause for the bleeding. No Battle sign. Nose: No congestion/rhinnorhea. No swelling. No septal hematoma. EARS: No hemotympanum bilaterally. Mouth/Throat: Mucous membranes are moist. No malocclusion or dental injury.  Neck: No stridor.  Supple.  No JVD. No midline C-spine tenderness to palpation, step-offs or deformities. Full range of motion without pain. Cardiovascular: Normal rate, regular rhythm. No murmurs, rubs or gallops.  Respiratory: Normal respiratory effort.  No retractions. Lungs CTAB.  No wheezes, rales or ronchi. Gastrointestinal: Soft and nontender. No distention. No peritoneal signs. Musculoskeletal: No LE edema. Full range of motion of the bilateral hips, knees and ankles, shoulders, elbows and wrists without pain. No midline T or L-spine tenderness to palpation, step-offs or deformities. Neurologic: Alert. Speech is clear.Face and smile symmetric. EOMI and PERRLA. No nystagmus horizontally or vertically. Tongue is midline. No pronator drift. 5 out of 5 grip, biceps, triceps, hip flexors, plantar flexion and dorsiflexion. Normal sensation to light touch in the  bilateral upper and lower extremities, and face.  Skin:  Skin is warm, dry. No laceration is noted but this needs to be reevaluated. Left hand has erythema and nonblanching purplish rash which she states has been there since yesterday and is unchanged. Psychiatric: Mood and affect are normal. Speech and behavior are normal.  Normal judgement.  ____________________________________________   LABS (all labs ordered are listed, but only abnormal results are displayed)  Labs Reviewed  GLUCOSE, CAPILLARY - Abnormal; Notable for the following:    Glucose-Capillary 101 (*)    All other components within normal limits  URINALYSIS COMPLETEWITH MICROSCOPIC ([facility] ONLY) - Abnormal; Notable for the following:    Color, Urine YELLOW (*)    APPearance CLEAR (*)    Hgb urine dipstick 1+ (*)    Protein, ur 100 (*)    Squamous Epithelial / LPF 0-5 (*)    All other components within normal limits  CBC - Abnormal; Notable for the following:    RBC 3.60 (*)    Hemoglobin 10.8 (*)    HCT 32.2 (*)    RDW 15.0 (*)    All other components within normal limits  BASIC METABOLIC PANEL - Abnormal; Notable for the following:    Glucose, Bld 135 (*)    BUN 24 (*)    All other components within normal limits   ____________________________________________  EKG  ED ECG REPORT I, Eula Listen, the attending physician, personally viewed and interpreted this ECG.   Date: 10/15/2015  EKG Time: 1143  Rate: 66  Rhythm: normal sinus rhythm  Axis: normal  Intervals:none  ST&T Change: Nonspecific T-wave inversion in V1. No ST elevation or depression.  ____________________________________________  RADIOLOGY  Ct Angio Head W/cm &/or Wo Cm  10/15/2015  ADDENDUM REPORT: 10/15/2015 13:56 ADDENDUM: Study discussed by telephone with Dr. Eula Listen on 10/15/2015 At 1351 hours. We reviewed the severe anterior cerebral artery disease. Also I advised that the tiny left ICA lesion described in #4 is  most likely an inconsequential infundibulum. Electronically Signed   By: Genevie Ann M.D.   On: 10/15/2015 13:56  10/15/2015  CLINICAL DATA:  82 year old female with dizziness and fall this morning. Initial encounter. EXAM: CT ANGIOGRAPHY HEAD AND NECK TECHNIQUE: Multidetector CT imaging of the head and neck was performed using the standard protocol during bolus administration of intravenous contrast. Multiplanar CT image reconstructions and MIPs were obtained to evaluate the vascular anatomy. Carotid stenosis measurements (when applicable) are obtained utilizing NASCET criteria, using the distal internal carotid diameter as the denominator. CONTRAST:  75 mL Isovue 370 COMPARISON:  Head CT without contrast 10/14/2015, and earlier. FINDINGS: CT HEAD Brain: No midline shift, mass effect, or evidence of intracranial mass lesion. Stable ventricle size and configuration. No acute intracranial hemorrhage identified. Gray-white matter differentiation appears stable. Evidence of chronic deep gray and white matter lacunar infarcts, as well is a small chronic cortically based infarct of the right cingulate gyrus (series 2, image 12). Calvarium and skull base: Calvarium intact. Paranasal sinuses: Visualized paranasal sinuses and mastoids are clear. Orbits: No scalp hematoma identified. No acute orbits soft tissue findings. CTA NECK Skeleton: Widespread cervical spine degeneration including multilevel spondylolisthesis. Widespread cervical facet hypertrophy. Bilateral posterior element alignment is within normal limits. Cervicothoracic junction alignment is within normal limits. No acute osseous abnormality identified. Other neck: Negative visualized lung parenchyma. No superior mediastinal lymphadenopathy. Negative thyroid, sub cm hypodense nodule on the right does not meet consensus criteria for ultrasound follow-up. Larynx, pharynx (motion artifact), parapharyngeal spaces, retropharyngeal space, sublingual space, submandibular  glands, and parotid glands are within normal limits. No cervical lymphadenopathy. Aortic arch: 3 vessel arch configuration. Moderate soft and calcified arch atherosclerosis. Right carotid system: No brachiocephalic or right CCA origin stenosis. Tortuous proximal right CCA. Widely patent right carotid bifurcation with no significant atherosclerosis. Motion artifact at the distal right ICA bulb. Otherwise negative cervical right ICA. Left carotid system: Calcified plaque at the left CCA origin but no stenosis. Tortuous left CCA with a partially retropharyngeal course. Minimal plaque at the left carotid bifurcation but more substantial calcified plaque in the distal posterior left ICA bulb. Still, stenosis appears less than 50 % with respect to the distal vessel. Motion artifact just distal to this plaque. Tortuous cervical left ICA with a kinked appearance at the C1 level, and again just below the skullbase. Vertebral arteries:No proximal right subclavian artery stenosis despite calcified plaque. Normal right vertebral artery origin. Non dominant right vertebral artery is intermittently tortuous and otherwise negative to the skullbase. Calcified plaque in the proximal left subclavian artery without significant stenosis. Calcified plaque at the left vertebral artery origin without significant stenosis. Dominant left vertebral artery is tortuous in the V1 segment. Additional proximal the 2 calcified plaque without stenosis. Tortuous V2 segment. No significant stenosis to the skullbase. CTA HEAD Posterior circulation: Dominant distal left vertebral artery. No distal vertebral artery stenosis common the non dominant right vertebral functionally terminates in PICA. Normal PICA origins. No basilar artery stenosis. Normal SCA and right PCA origins. Fetal type left PCA origin. Diminutive right posterior communicating artery. Left PCA branches are within normal limits. There is mild to moderate stenosis in the right PCA P2  segment  best seen on series 13 image 13. Otherwise negative right PCA branches. Anterior circulation: Both ICA siphons are patent with mild tortuosity. Minimal left calcified plaque. More calcified plaque on the right, most pronounced in the supraclinoid segment, but no hemodynamically significant stenosis results. Normal left posterior communicating artery origin, the proximal to that there is a small left ICA superior high apophyseal infundibulum or aneurysm measuring 1-2 mm (series 12, image 78). Patent carotid termini. Normal MCA and ACA origins. Dominant right ACA A1 segment . Extensive ACA atherosclerosis and stenosis. Occluded left ACA A2 segment with distal reconstituted flow, but continued high-grade irregularity and multifocal high-grade distal left ACA stenoses. Irregularity with a high-grade stenosis in the azygos A2 segment (series 13, image 14) with preserved distal flow, but then additional moderate irregularity in the right ACA branches. Bilateral MCA M1 segments, bifurcations, and MCA branches are normal aside from mild bilateral in 3 segment irregularity. Venous sinuses: Patent. Anatomic variants: Dominant left vertebral artery. Fetal type left PCA origin. Dominant right ACA A1 segment with azygos type A2 anatomy. Delayed phase: Mild venous related enhancement suspected on series 15, image 20 in the right ACA subcortical white matter. No definite abnormal intracranial enhancement. IMPRESSION: 1. No significant arterial stenosis in the neck. Tortuous carotid arteries with intermittent calcified plaque but no significant carotid artery stenosis. 2. Negative for emergent large vessel occlusion, but positive for segmental occlusion of the left ACA A2 segment. Also High-grade stenosis in the right ACA A2 segment with preserved distal flow. 3. Lesser posterior circulation atherosclerosis, with mild to moderate right PCA P2 segment stenosis. 4. 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 5.  No acute cortically based infarct identified by CT. No intracranial hemorrhage. 6. Chronic basal ganglia, cerebral white matter, and right ACA territory ischemia. Electronically Signed: By: Genevie Ann M.D. On: 10/15/2015 13:47   Ct Angio Neck W/cm &/or Wo/cm  10/15/2015  ADDENDUM REPORT: 10/15/2015 13:56 ADDENDUM: Study discussed by telephone with Dr. Eula Listen on 10/15/2015 At 1351 hours. We reviewed the severe anterior cerebral artery disease. Also I advised that the tiny left ICA lesion described in #4 is most likely an inconsequential infundibulum. Electronically Signed   By: Genevie Ann M.D.   On: 10/15/2015 13:56  10/15/2015  CLINICAL DATA:  82 year old female with dizziness and fall this morning. Initial encounter. EXAM: CT ANGIOGRAPHY HEAD AND NECK TECHNIQUE: Multidetector CT imaging of the head and neck was performed using the standard protocol during bolus administration of intravenous contrast. Multiplanar CT image reconstructions and MIPs were obtained to evaluate the vascular anatomy. Carotid stenosis measurements (when applicable) are obtained utilizing NASCET criteria, using the distal internal carotid diameter as the denominator. CONTRAST:  75 mL Isovue 370 COMPARISON:  Head CT without contrast 10/14/2015, and earlier. FINDINGS: CT HEAD Brain: No midline shift, mass effect, or evidence of intracranial mass lesion. Stable ventricle size and configuration. No acute intracranial hemorrhage identified. Gray-white matter differentiation appears stable. Evidence of chronic deep gray and white matter lacunar infarcts, as well is a small chronic cortically based infarct of the right cingulate gyrus (series 2, image 12). Calvarium and skull base: Calvarium intact. Paranasal sinuses: Visualized paranasal sinuses and mastoids are clear. Orbits: No scalp hematoma identified. No acute orbits soft tissue findings. CTA NECK Skeleton: Widespread cervical spine degeneration including multilevel  spondylolisthesis. Widespread cervical facet hypertrophy. Bilateral posterior element alignment is within normal limits. Cervicothoracic junction alignment is within normal limits. No acute osseous abnormality identified. Other neck: Negative visualized lung  parenchyma. No superior mediastinal lymphadenopathy. Negative thyroid, sub cm hypodense nodule on the right does not meet consensus criteria for ultrasound follow-up. Larynx, pharynx (motion artifact), parapharyngeal spaces, retropharyngeal space, sublingual space, submandibular glands, and parotid glands are within normal limits. No cervical lymphadenopathy. Aortic arch: 3 vessel arch configuration. Moderate soft and calcified arch atherosclerosis. Right carotid system: No brachiocephalic or right CCA origin stenosis. Tortuous proximal right CCA. Widely patent right carotid bifurcation with no significant atherosclerosis. Motion artifact at the distal right ICA bulb. Otherwise negative cervical right ICA. Left carotid system: Calcified plaque at the left CCA origin but no stenosis. Tortuous left CCA with a partially retropharyngeal course. Minimal plaque at the left carotid bifurcation but more substantial calcified plaque in the distal posterior left ICA bulb. Still, stenosis appears less than 50 % with respect to the distal vessel. Motion artifact just distal to this plaque. Tortuous cervical left ICA with a kinked appearance at the C1 level, and again just below the skullbase. Vertebral arteries:No proximal right subclavian artery stenosis despite calcified plaque. Normal right vertebral artery origin. Non dominant right vertebral artery is intermittently tortuous and otherwise negative to the skullbase. Calcified plaque in the proximal left subclavian artery without significant stenosis. Calcified plaque at the left vertebral artery origin without significant stenosis. Dominant left vertebral artery is tortuous in the V1 segment. Additional proximal the 2  calcified plaque without stenosis. Tortuous V2 segment. No significant stenosis to the skullbase. CTA HEAD Posterior circulation: Dominant distal left vertebral artery. No distal vertebral artery stenosis common the non dominant right vertebral functionally terminates in PICA. Normal PICA origins. No basilar artery stenosis. Normal SCA and right PCA origins. Fetal type left PCA origin. Diminutive right posterior communicating artery. Left PCA branches are within normal limits. There is mild to moderate stenosis in the right PCA P2 segment best seen on series 13 image 13. Otherwise negative right PCA branches. Anterior circulation: Both ICA siphons are patent with mild tortuosity. Minimal left calcified plaque. More calcified plaque on the right, most pronounced in the supraclinoid segment, but no hemodynamically significant stenosis results. Normal left posterior communicating artery origin, the proximal to that there is a small left ICA superior high apophyseal infundibulum or aneurysm measuring 1-2 mm (series 12, image 78). Patent carotid termini. Normal MCA and ACA origins. Dominant right ACA A1 segment . Extensive ACA atherosclerosis and stenosis. Occluded left ACA A2 segment with distal reconstituted flow, but continued high-grade irregularity and multifocal high-grade distal left ACA stenoses. Irregularity with a high-grade stenosis in the azygos A2 segment (series 13, image 14) with preserved distal flow, but then additional moderate irregularity in the right ACA branches. Bilateral MCA M1 segments, bifurcations, and MCA branches are normal aside from mild bilateral in 3 segment irregularity. Venous sinuses: Patent. Anatomic variants: Dominant left vertebral artery. Fetal type left PCA origin. Dominant right ACA A1 segment with azygos type A2 anatomy. Delayed phase: Mild venous related enhancement suspected on series 15, image 20 in the right ACA subcortical white matter. No definite abnormal intracranial  enhancement. IMPRESSION: 1. No significant arterial stenosis in the neck. Tortuous carotid arteries with intermittent calcified plaque but no significant carotid artery stenosis. 2. Negative for emergent large vessel occlusion, but positive for segmental occlusion of the left ACA A2 segment. Also High-grade stenosis in the right ACA A2 segment with preserved distal flow. 3. Lesser posterior circulation atherosclerosis, with mild to moderate right PCA P2 segment stenosis. 4. 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 5. No  acute cortically based infarct identified by CT. No intracranial hemorrhage. 6. Chronic basal ganglia, cerebral white matter, and right ACA territory ischemia. Electronically Signed: By: Genevie Ann M.D. On: 10/15/2015 13:47    ____________________________________________   PROCEDURES  Procedure(s) performed: None  Critical Care performed: No ____________________________________________   INITIAL IMPRESSION / ASSESSMENT AND PLAN / ED COURSE  Pertinent labs & imaging results that were available during my care of the patient were reviewed by me and considered in my medical decision making (see chart for details).  82 y.o. female with a history of cognitive impairment presenting with dizziness and fall. The patient's dizziness could be from multiple etiologies although her workup yesterday was reassuring. Her anemia is chronic and stable this is unlikely to be the cause. She did not have any intracranial abnormalities yesterday, but I will reevaluate her with CT scan given her fall to make sure she did not cause an injury today with her fall. Given that she has ongoing symptoms and has multiple risk factors, I will get a CT angiogram of the head and neck to rule out posterior stroke. Also evaluate her for UTI, electrolyte disturbance. She has no evidence of arrhythmia on her EKG. Plan to initiate symptomatic treatment and reevaluated the patient after diagnostic workup is  complete.  Repeat blood pressure on my exam is 152/121.  ----------------------------------------- 3:33 PM on 10/15/2015 -----------------------------------------  The patient reports that her dizziness has improved. Her CT scan does show an occlusion of the left ACA, which is age indeterminate. She does have some stenosis on the right side which is likely chronic. There were no cerebral infarcts. Her posterior circulation has some atherosclerotic disease but no obvious abnormalities.  I have reevaluated the patient's scalp and still do not find any laceration. Her scalp is hemostatic at this time, so the most likely etiology is an abrasion that was bleeding.  Given the patient's symptoms, an abnormal CT scan findings, I'll plan to admit her to the hospital at this time. 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

## 2015-10-15 NOTE — ED Notes (Signed)
Pt alert and oriented, reports no pain. Pt asking when she will be going to the floor. Pt updated on situation. Pt given snack.

## 2015-10-15 NOTE — ED Notes (Signed)
Pt assigned a room in 1A. 1A not taking patient because of stroke. Clarified with MD that stroke is old and admitting diagnosis is dizziness. Pt will be readmitted to 1A.

## 2015-10-15 NOTE — ED Notes (Signed)
1C called to report pt should be going back to 1A. Bed supervisor is being notified. Waiting on placement for pt.

## 2015-10-16 DIAGNOSIS — R42 Dizziness and giddiness: Secondary | ICD-10-CM | POA: Diagnosis not present

## 2015-10-16 LAB — LIPID PANEL
Cholesterol: 232 mg/dL — ABNORMAL HIGH (ref 0–200)
HDL: 35 mg/dL — AB (ref >40)
LDL CALC: 158 mg/dL — AB (ref 0–99)
Total CHOL/HDL Ratio: 6.6 RATIO
Triglycerides: 195 mg/dL — ABNORMAL HIGH (ref <150)
VLDL: 39 mg/dL (ref 0–40)

## 2015-10-16 LAB — CBC
HCT: 32.4 % — ABNORMAL LOW (ref 35.0–47.0)
Hemoglobin: 10.8 g/dL — ABNORMAL LOW (ref 12.0–16.0)
MCH: 29.8 pg (ref 26.0–34.0)
MCHC: 33.5 g/dL (ref 32.0–36.0)
MCV: 89 fL (ref 80.0–100.0)
PLATELETS: 346 10*3/uL (ref 150–440)
RBC: 3.64 MIL/uL — AB (ref 3.80–5.20)
RDW: 15.2 % — AB (ref 11.5–14.5)
WBC: 8.5 10*3/uL (ref 3.6–11.0)

## 2015-10-16 LAB — HEMOGLOBIN A1C: HEMOGLOBIN A1C: 7.5 % — AB (ref 4.0–6.0)

## 2015-10-16 LAB — BASIC METABOLIC PANEL
Anion gap: 5 (ref 5–15)
BUN: 21 mg/dL — AB (ref 6–20)
CALCIUM: 8.5 mg/dL — AB (ref 8.9–10.3)
CHLORIDE: 107 mmol/L (ref 101–111)
CO2: 25 mmol/L (ref 22–32)
CREATININE: 0.85 mg/dL (ref 0.44–1.00)
GFR calc non Af Amer: >60 mL/min (ref >60)
Glucose, Bld: 139 mg/dL — ABNORMAL HIGH (ref 65–99)
Potassium: 3.5 mmol/L (ref 3.5–5.1)
Sodium: 137 mmol/L (ref 135–145)

## 2015-10-16 LAB — GLUCOSE, CAPILLARY
GLUCOSE-CAPILLARY: 157 mg/dL — AB (ref 65–99)
Glucose-Capillary: 124 mg/dL — ABNORMAL HIGH (ref 65–99)
Glucose-Capillary: 83 mg/dL (ref 65–99)
Glucose-Capillary: 147 mg/dL — ABNORMAL HIGH (ref 65–99)

## 2015-10-16 MED ORDER — MECLIZINE HCL 25 MG PO TABS: 25.0000 mg | ORAL_TABLET | Freq: Three times a day (TID) | ORAL | Status: DC | PRN

## 2015-10-16 MED ORDER — ASPIRIN EC 325 MG PO TBEC
325.0000 mg | DELAYED_RELEASE_TABLET | Freq: Every day | ORAL | Status: DC
  Administered 2015-10-16 – 2015-10-17 (×2): 325 mg via ORAL
  Filled 2015-10-16 (×2): qty 1

## 2015-10-16 NOTE — Progress Notes (Signed)
Pt hallucentating.  Attempted to reorient to hospital and took pt on walk with gait belt and walker to hall.  Returned pt to room and placed in bed.  Pt states she is afraid to be alone.  Left door open, bed alarm on and frequent checks on pt.  Pt removed IV.  Replaced with new IV to right forearm. Dorna Bloom RN

## 2015-10-16 NOTE — Progress Notes (Signed)
Pt called out that there is a child in her bed and wants to know what she is supposed to do.  Reoriented pt and left a light on.  Bed alarm in use. Dorna Bloom RN

## 2015-10-16 NOTE — Evaluation (Signed)
Physical Therapy Evaluation Patient Details Name: Sharon Dickson MRN: [id number] DOB: 09/27/1933 Today's Date: 10/16/2015   History of Present Illness  Pt has been having some confusion and weakness for a Powless months, was in the ER a Jerde days ago after fall   Clinical Impression  Pt is able to ambulate fairly well with walker, but using only single hand on rail or no support she is unsteady and shows definite balance/coordination deficits.  Husband states that she has no been herself for the last [place] months and has generally not been able to do her normal activities.  Pt is negative for b/l Dix-Hallpike and has no noted nystagmus t/o the session.  She has no overt LOBs, but was generally unsteady during ambulation.      Follow Up Recommendations Outpatient PT    Equipment Recommendations   (instructed pt/husband to start using walker consistently)    Recommendations for Other Services       Precautions / Restrictions Precautions Precautions: Fall Restrictions Weight Bearing Restrictions: No      Mobility  Bed Mobility Overal bed mobility: Modified Independent             General bed mobility comments: Pt able to get to sitting EOB w/o direct assist, reports some lightheadness, no nystagmus of dizziness  Transfers Overall transfer level: Modified independent Equipment used: Rolling walker (2 wheeled)             General transfer comment: Pt is not overly reliant on UEs to get to standing, but does need walker to stabilize once up.  Ambulation/Gait Ambulation/Gait assistance: Min assist Ambulation Distance (Feet): 75 Feet Assistive device: 1 person hand held assist;Rolling walker (2 wheeled);None       General Gait Details: Pt walks the first 25 ft with FWW, then we used a single hallway rail.  She was able to maintain balance, but was not steady.  When she had to go w/o rail she displayed decreased balance and somewhat more ataxic gait.  Stairs             Wheelchair Mobility    Modified Rankin (Stroke Patients Only)       Balance Overall balance assessment: Needs assistance Sitting-balance support: No upper extremity supported Sitting balance-Leahy Scale: Good     Standing balance support: No upper extremity supported Standing balance-Leahy Scale: Fair Standing balance comment: Pt has occasional miss steps and definite need of UEs to maintain balance.  She does not seem aware that she is as unsteady as she actaully is.                             Pertinent Vitals/Pain Pain Assessment: No/denies pain    Home Living Family/patient expects to be discharged to:: Private residence Living Arrangements: Spouse/significant other   Type of Home: House Home Access: Stairs to enter Entrance Stairs-Rails: None Entrance Stairs-Number of Steps: 2   Home Equipment: Environmental consultant - 2 wheels;Cane - single point Additional Comments: Pt's neurologist suggested using a cane early this year - she has been inconsistent but can get out of the house to go to church weekly    Prior Function Level of Independence: Needs assistance   Gait / Transfers Assistance Needed: Apprently prior to a [place] months ago she was able to ambulate independently and safely           Hand Dominance        Extremity/Trunk Assessment  Upper Extremity Assessment: Overall WFL for tasks assessed           Lower Extremity Assessment: Overall WFL for tasks assessed         Communication   Communication: HOH (some confusion)  Cognition Arousal/Alertness: Awake/alert Behavior During Therapy: Flat affect Overall Cognitive Status: History of cognitive impairments - at baseline (has been consistently "off" for a Shreeve months now)                      General Comments General comments (skin integrity, edema, etc.): Performed Dix-Hallpike maneuver, she is (-) bilaterally and does not appear to have any nystagmus when she has described  dizziness    Exercises        Assessment/Plan    PT Assessment Patient needs continued PT services  PT Diagnosis Difficulty walking;Generalized weakness   PT Problem List Decreased balance;Decreased coordination;Decreased cognition;Decreased strength;Decreased activity tolerance;Decreased mobility;Decreased safety awareness  PT Treatment Interventions DME instruction;Gait training;Stair training;Therapeutic activities;Therapeutic exercise;Functional mobility training;Balance training;Neuromuscular re-education;Cognitive remediation;Patient/family education   PT Goals (Current goals can be found in the Care Plan section) Acute Rehab PT Goals Patient Stated Goal: stop feeling dizzy PT Goal Formulation: With patient/family Time For Goal Achievement: 10/29/15 Potential to Achieve Goals: Fair    Frequency Min 2X/week   Barriers to discharge        Co-evaluation               End of Session Equipment Utilized During Treatment: Gait belt Activity Tolerance: Patient limited by fatigue Patient left: with bed alarm set;with call bell/phone within reach      Functional Assessment Tool Used:  (clinical judgement) Functional Limitation: Mobility: Walking and moving around Mobility: Walking and Moving Around Current Status [id number]): At least 40 percent but less than 60 percent impaired, limited or restricted Mobility: Walking and Moving Around Goal Status [phone]): At least 1 percent but less than 20 percent impaired, limited or restricted    Time: [id number] PT Time Calculation (min) (ACUTE ONLY): 27 min   Charges:   PT Evaluation $PT Eval Low Complexity: 1 Procedure     PT G Codes:   PT G-Codes **NOT FOR INPATIENT CLASS** Functional Assessment Tool Used:  (clinical judgement) Functional Limitation: Mobility: Walking and moving around Mobility: Walking and Moving Around Current Status [id number]): At least 40 percent but less than 60 percent impaired, limited or  restricted Mobility: Walking and Moving Around Goal Status [phone]): At least 1 percent but less than 20 percent impaired, limited or restricted   Wayne Both, PT, DPT [phone]  [name] 10/16/2015, 5:34 PM

## 2015-10-16 NOTE — Progress Notes (Signed)
82 y.o. female with a known history of Hypertension, diabetes and hyperlipidemia. The patient has had the dizziness for a Dubas days. She had been spinning sensation worse with positional changes  Information obtained from husband and daughter over phone.  Pt's vertigo is positional. Worse with head turns/sitting/standing. She has been diagnosed with Lewy body dementia.   Imaging reviewed. Pt has significant atherosclerotic dz specifically in the anterior circulation. No anti platelet therapy prior to admission.    This is likely BPPV. Basilar is patent.    - Meclizine/Valium PRN - PT tomorrow for dix hall pike maneuver as well as Epley's maneuver - possible d/c in 1-2 days once symptoms improve - ASA 325 daily.  Leotis Pain

## 2015-10-16 NOTE — Progress Notes (Signed)
Spoke with Dr Jannifer Franklin secondary to pt complaints of anxiety and inability to sleep.  Ordered po ativan. Dorna Bloom RN

## 2015-10-16 NOTE — Discharge Instructions (Signed)

## 2015-10-17 ENCOUNTER — Encounter: Payer: Medicare Other | Admitting: Physical Therapy

## 2015-10-17 DIAGNOSIS — R42 Dizziness and giddiness: Secondary | ICD-10-CM | POA: Diagnosis not present

## 2015-10-17 LAB — GLUCOSE, CAPILLARY
GLUCOSE-CAPILLARY: 62 mg/dL — AB (ref 65–99)
GLUCOSE-CAPILLARY: 102 mg/dL — AB (ref 65–99)
GLUCOSE-CAPILLARY: 187 mg/dL — AB (ref 65–99)
Glucose-Capillary: 67 mg/dL (ref 65–99)

## 2015-10-17 MED ORDER — HALOPERIDOL LACTATE 5 MG/ML IJ SOLN
2.0000 mg | Freq: Once | INTRAMUSCULAR | Status: AC
  Administered 2015-10-17: 04:00:00 2 mg via INTRAVENOUS
  Filled 2015-10-17: qty 1

## 2015-10-17 MED ORDER — HALOPERIDOL LACTATE 5 MG/ML IJ SOLN: 2.0000 mg | Freq: Four times a day (QID) | INTRAMUSCULAR | Status: DC | PRN

## 2015-10-17 MED ORDER — ASPIRIN 325 MG PO TBEC: 325.0000 mg | DELAYED_RELEASE_TABLET | Freq: Every day | ORAL | Status: DC

## 2015-10-17 MED ORDER — AMLODIPINE BESYLATE 10 MG PO TABS: 10.0000 mg | ORAL_TABLET | Freq: Every day | ORAL | Status: DC

## 2015-10-17 MED ORDER — LOSARTAN POTASSIUM 50 MG PO TABS
100.0000 mg | ORAL_TABLET | Freq: Every day | ORAL | Status: DC
  Administered 2015-10-17: 100 mg via ORAL
  Filled 2015-10-17: qty 2

## 2015-10-17 MED ORDER — HYDRALAZINE HCL 20 MG/ML IJ SOLN: 20.0000 mg | Freq: Four times a day (QID) | INTRAMUSCULAR | Status: DC | PRN

## 2015-10-17 MED ORDER — AMLODIPINE BESYLATE 10 MG PO TABS
10.0000 mg | ORAL_TABLET | Freq: Every day | ORAL | Status: DC
  Administered 2015-10-17: 09:00:00 10 mg via ORAL
  Filled 2015-10-17: qty 1

## 2015-10-17 NOTE — Progress Notes (Signed)
Husband given d/c instructions r/t activity, diet, followup care and medications, prescriptions x 2 given to husband with 1 called into pharmacy by MD. Voiced understanding, pt d/c home via wheelchair escorted by staff and husband

## 2015-10-17 NOTE — Consult Note (Signed)
CC: dizziness   HPI: Sharon Dickson is an 82 y.o. female with a known history of Hypertension, diabetes and hyperlipidemia. The patient has had the dizziness for a [place] days. She had been spinning sensation worse with positional changes. Pt has history of lwey body dementia.  Improved this AM. On meclizine.  Imaging reviewed. Pt has significant atherosclerotic dz specifically in the anterior circulation.   Past Medical History  Diagnosis Date  . Cancer ([place])     skin ca  . Chronic kidney disease   . GERD (gastroesophageal reflux disease)   . Bipolar disorder ([place])   . Anxiety   . Hypertension   . Diabetes mellitus without complication ([place])   . Hyperlipidemia   . Proteinuria   . Allergic rhinitis   . Benign essential tremor   . Anemia   . Vitamin B 12 deficiency   . Cognitive impairment   . UTI (urinary tract infection)   . SVT (supraventricular tachycardia) ([place])   . Cystocele   . Cystic disease of breast   . Diverticulosis   . Depression   . Wears dentures     partial lower    Past Surgical History  Procedure Laterality Date  . Appendectomy    . Abdominal hysterectomy    . Cholecystectomy    . Bladder surgery  10/2008  . Colonoscopy  11/09/2014  . Colonoscopy with propofol N/A 04/05/2015    Procedure: COLONOSCOPY WITH PROPOFOL;  Surgeon: Lollie Sails, MD;  Location: [facility] ENDOSCOPY;  Service: Endoscopy;  Laterality: N/A;  . Cataract extraction w/phaco Right 08/03/2015    Procedure: CATARACT EXTRACTION PHACO AND INTRAOCULAR LENS PLACEMENT (IOC);  Surgeon: Leandrew Koyanagi, MD;  Location: [place];  Service: Ophthalmology;  Laterality: Right;  DIABETIC - insulin  . Cataract extraction w/phaco Left 09/28/2015    Procedure: CATARACT EXTRACTION PHACO AND INTRAOCULAR LENS PLACEMENT (IOC);  Surgeon: Leandrew Koyanagi, MD;  Location: [facility];  Service: Ophthalmology;  Laterality: Left;  DIABETIC LEAVE PT 3RD    Family History  Problem  Relation Age of Onset  . Breast cancer Daughter 60    Social History:  reports that she has never smoked. She has never used smokeless tobacco. She reports that she does not drink alcohol or use illicit drugs.  Allergies  Allergen Reactions  . Ceftin [Cefuroxime Axetil] Nausea And Vomiting  . Codeine Nausea And Vomiting  . Metformin Diarrhea  . Paxil [Paroxetine Hcl] Other (See Comments)    Reaction:  Unknown   . Prozac [Fluoxetine Hcl] Other (See Comments)    Reaction:  Dizziness     Medications: I have reviewed the patient's current medications.  ROS: History obtained from the patient  General ROS: negative for - chills, fatigue, fever, night sweats, weight gain or weight loss Psychological ROS: negative for - behavioral disorder, hallucinations, memory difficulties, mood swings or suicidal ideation Ophthalmic ROS: negative for - blurry vision, double vision, eye pain or loss of vision ENT ROS: negative for - epistaxis, nasal discharge, oral lesions, sore throat, tinnitus or vertigo Allergy and Immunology ROS: negative for - hives or itchy/watery eyes Hematological and Lymphatic ROS: negative for - bleeding problems, bruising or swollen lymph nodes Endocrine ROS: negative for - galactorrhea, hair pattern changes, polydipsia/polyuria or temperature intolerance Respiratory ROS: negative for - cough, hemoptysis, shortness of breath or wheezing Cardiovascular ROS: negative for - chest pain, dyspnea on exertion, edema or irregular heartbeat Gastrointestinal ROS: negative for - abdominal pain, diarrhea, hematemesis, nausea/vomiting or  stool incontinence Genito-Urinary ROS: negative for - dysuria, hematuria, incontinence or urinary frequency/urgency Musculoskeletal ROS: negative for - joint swelling or muscular weakness Neurological ROS: as noted in HPI Dermatological ROS: negative for rash and skin lesion changes  Physical Examination: Blood pressure 200/110, pulse 75, temperature  97.7 F (36.5 C), temperature source Oral, resp. rate 18, height 5\' 3"  (1.6 m), weight 125 lb 11.2 oz (57.017 kg), SpO2 86 %.   Neurological Examination Mental Status: Alert, oriented, thought content appropriate.  Speech fluent without evidence of aphasia.  Able to follow 3 step commands without difficulty. Cranial Nerves: II: Discs flat bilaterally; Visual fields grossly normal, pupils equal, round, reactive to light and accommodation III,IV, VI: ptosis not present, extra-ocular motions intact bilaterally V,VII: smile symmetric, facial light touch sensation normal bilaterally VIII: hearing normal bilaterally IX,X: gag reflex present XI: bilateral shoulder shrug XII: midline tongue extension Motor: Right : Upper extremity   5/5    Left:     Upper extremity   5/5  Lower extremity   5/5     Lower extremity   5/5 Tone and bulk:normal tone throughout; no atrophy noted Sensory: Pinprick and light touch intact throughout, bilaterally Deep Tendon Reflexes: 1+ and symmetric throughout Plantars: Right: downgoing   Left: downgoing Cerebellar: normal finger-to-nose, normal rapid alternating movements and normal heel-to-shin test Gait: not tested      Laboratory Studies:   Basic Metabolic Panel:  Recent Labs Lab 10/14/15 1610 10/15/15 1200 10/16/15 0433  NA 135 138 137  K 3.3* 3.8 3.5  CL 110 109 107  CO2 19* 24 25  GLUCOSE 148* 135* 139*  BUN 18 24* 21*  CREATININE 0.64 0.89 0.85  CALCIUM 7.8* 8.9 8.5*    Liver Function Tests:  Recent Labs Lab 10/14/15 1610  AST 16  ALT 12*  ALKPHOS 54  BILITOT 0.4  PROT 6.0*  ALBUMIN 2.9*   No results for input(s): LIPASE, AMYLASE in the last 168 hours. No results for input(s): AMMONIA in the last 168 hours.  CBC:  Recent Labs Lab 10/14/15 1610 10/15/15 1200 10/16/15 0433  WBC 7.1 8.3 8.5  NEUTROABS 5.0  --   --   HGB 11.1* 10.8* 10.8*  HCT 33.5* 32.2* 32.4*  MCV 90.3 89.4 89.0  PLT 329 363 346    Cardiac  Enzymes:  Recent Labs Lab 10/14/15 1610  TROPONINI <0.03    BNP: Invalid input(s): POCBNP  CBG:  Recent Labs Lab 10/16/15 1643 10/16/15 2150 10/17/15 0725 10/17/15 0853 10/17/15 0935  GLUCAP 83 147* 67 62* 102*    Microbiology: No results found for this or any previous visit.  Coagulation Studies: No results for input(s): LABPROT, INR in the last 72 hours.  Urinalysis:  Recent Labs Lab 10/14/15 1610 10/15/15 1320  COLORURINE STRAW* YELLOW*  LABSPEC 1.009 1.018  PHURINE 6.0 5.0  GLUCOSEU 50* NEGATIVE  HGBUR NEGATIVE 1+*  BILIRUBINUR NEGATIVE NEGATIVE  KETONESUR NEGATIVE NEGATIVE  PROTEINUR 100* 100*  NITRITE NEGATIVE NEGATIVE  LEUKOCYTESUR NEGATIVE NEGATIVE    Lipid Panel:     Component Value Date/Time   CHOL 232* 10/16/2015 0433   TRIG 195* 10/16/2015 0433   HDL 35* 10/16/2015 0433   CHOLHDL 6.6 10/16/2015 0433   VLDL 39 10/16/2015 0433   LDLCALC 158* 10/16/2015 0433    HgbA1C:  Lab Results  Component Value Date   HGBA1C 7.5* 10/15/2015    Urine Drug Screen:  No results found for: LABOPIA, COCAINSCRNUR, LABBENZ, AMPHETMU, THCU, LABBARB  Alcohol Level: No  results for input(s): ETH in the last 168 hours.  Other results: EKG: normal EKG, normal sinus rhythm, unchanged from previous tracings.  Imaging: Ct Angio Head W/cm &/or Wo Cm  10/15/2015  ADDENDUM REPORT: 10/15/2015 13:56 ADDENDUM: Study discussed by telephone with Dr. Eula Listen on 10/15/2015 At 1351 hours. We reviewed the severe anterior cerebral artery disease. Also I advised that the tiny left ICA lesion described in #4 is most likely an inconsequential infundibulum. Electronically Signed   By: Genevie Ann M.D.   On: 10/15/2015 13:56  10/15/2015  CLINICAL DATA:  82 year old female with dizziness and fall this morning. Initial encounter. EXAM: CT ANGIOGRAPHY HEAD AND NECK TECHNIQUE: Multidetector CT imaging of the head and neck was performed using the standard protocol during bolus  administration of intravenous contrast. Multiplanar CT image reconstructions and MIPs were obtained to evaluate the vascular anatomy. Carotid stenosis measurements (when applicable) are obtained utilizing NASCET criteria, using the distal internal carotid diameter as the denominator. CONTRAST:  75 mL Isovue 370 COMPARISON:  Head CT without contrast 10/14/2015, and earlier. FINDINGS: CT HEAD Brain: No midline shift, mass effect, or evidence of intracranial mass lesion. Stable ventricle size and configuration. No acute intracranial hemorrhage identified. Gray-white matter differentiation appears stable. Evidence of chronic deep gray and white matter lacunar infarcts, as well is a small chronic cortically based infarct of the right cingulate gyrus (series 2, image 12). Calvarium and skull base: Calvarium intact. Paranasal sinuses: Visualized paranasal sinuses and mastoids are clear. Orbits: No scalp hematoma identified. No acute orbits soft tissue findings. CTA NECK Skeleton: Widespread cervical spine degeneration including multilevel spondylolisthesis. Widespread cervical facet hypertrophy. Bilateral posterior element alignment is within normal limits. Cervicothoracic junction alignment is within normal limits. No acute osseous abnormality identified. Other neck: Negative visualized lung parenchyma. No superior mediastinal lymphadenopathy. Negative thyroid, sub cm hypodense nodule on the right does not meet consensus criteria for ultrasound follow-up. Larynx, pharynx (motion artifact), parapharyngeal spaces, retropharyngeal space, sublingual space, submandibular glands, and parotid glands are within normal limits. No cervical lymphadenopathy. Aortic arch: 3 vessel arch configuration. Moderate soft and calcified arch atherosclerosis. Right carotid system: No brachiocephalic or right CCA origin stenosis. Tortuous proximal right CCA. Widely patent right carotid bifurcation with no significant atherosclerosis. Motion  artifact at the distal right ICA bulb. Otherwise negative cervical right ICA. Left carotid system: Calcified plaque at the left CCA origin but no stenosis. Tortuous left CCA with a partially retropharyngeal course. Minimal plaque at the left carotid bifurcation but more substantial calcified plaque in the distal posterior left ICA bulb. Still, stenosis appears less than 50 % with respect to the distal vessel. Motion artifact just distal to this plaque. Tortuous cervical left ICA with a kinked appearance at the C1 level, and again just below the skullbase. Vertebral arteries:No proximal right subclavian artery stenosis despite calcified plaque. Normal right vertebral artery origin. Non dominant right vertebral artery is intermittently tortuous and otherwise negative to the skullbase. Calcified plaque in the proximal left subclavian artery without significant stenosis. Calcified plaque at the left vertebral artery origin without significant stenosis. Dominant left vertebral artery is tortuous in the V1 segment. Additional proximal the 2 calcified plaque without stenosis. Tortuous V2 segment. No significant stenosis to the skullbase. CTA HEAD Posterior circulation: Dominant distal left vertebral artery. No distal vertebral artery stenosis common the non dominant right vertebral functionally terminates in PICA. Normal PICA origins. No basilar artery stenosis. Normal SCA and right PCA origins. Fetal type left PCA origin. Diminutive right posterior  communicating artery. Left PCA branches are within normal limits. There is mild to moderate stenosis in the right PCA P2 segment best seen on series 13 image 13. Otherwise negative right PCA branches. Anterior circulation: Both ICA siphons are patent with mild tortuosity. Minimal left calcified plaque. More calcified plaque on the right, most pronounced in the supraclinoid segment, but no hemodynamically significant stenosis results. Normal left posterior communicating artery  origin, the proximal to that there is a small left ICA superior high apophyseal infundibulum or aneurysm measuring 1-2 mm (series 12, image 78). Patent carotid termini. Normal MCA and ACA origins. Dominant right ACA A1 segment . Extensive ACA atherosclerosis and stenosis. Occluded left ACA A2 segment with distal reconstituted flow, but continued high-grade irregularity and multifocal high-grade distal left ACA stenoses. Irregularity with a high-grade stenosis in the azygos A2 segment (series 13, image 14) with preserved distal flow, but then additional moderate irregularity in the right ACA branches. Bilateral MCA M1 segments, bifurcations, and MCA branches are normal aside from mild bilateral in 3 segment irregularity. Venous sinuses: Patent. Anatomic variants: Dominant left vertebral artery. Fetal type left PCA origin. Dominant right ACA A1 segment with azygos type A2 anatomy. Delayed phase: Mild venous related enhancement suspected on series 15, image 20 in the right ACA subcortical white matter. No definite abnormal intracranial enhancement. IMPRESSION: 1. No significant arterial stenosis in the neck. Tortuous carotid arteries with intermittent calcified plaque but no significant carotid artery stenosis. 2. Negative for emergent large vessel occlusion, but positive for segmental occlusion of the left ACA A2 segment. Also High-grade stenosis in the right ACA A2 segment with preserved distal flow. 3. Lesser posterior circulation atherosclerosis, with mild to moderate right PCA P2 segment stenosis. 4. 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 5. No acute cortically based infarct identified by CT. No intracranial hemorrhage. 6. Chronic basal ganglia, cerebral white matter, and right ACA territory ischemia. Electronically Signed: By: Genevie Ann M.D. On: 10/15/2015 13:47   Ct Angio Neck W/cm &/or Wo/cm  10/15/2015  ADDENDUM REPORT: 10/15/2015 13:56 ADDENDUM: Study discussed by telephone with Dr.  Eula Listen on 10/15/2015 At 1351 hours. We reviewed the severe anterior cerebral artery disease. Also I advised that the tiny left ICA lesion described in #4 is most likely an inconsequential infundibulum. Electronically Signed   By: Genevie Ann M.D.   On: 10/15/2015 13:56  10/15/2015  CLINICAL DATA:  82 year old female with dizziness and fall this morning. Initial encounter. EXAM: CT ANGIOGRAPHY HEAD AND NECK TECHNIQUE: Multidetector CT imaging of the head and neck was performed using the standard protocol during bolus administration of intravenous contrast. Multiplanar CT image reconstructions and MIPs were obtained to evaluate the vascular anatomy. Carotid stenosis measurements (when applicable) are obtained utilizing NASCET criteria, using the distal internal carotid diameter as the denominator. CONTRAST:  75 mL Isovue 370 COMPARISON:  Head CT without contrast 10/14/2015, and earlier. FINDINGS: CT HEAD Brain: No midline shift, mass effect, or evidence of intracranial mass lesion. Stable ventricle size and configuration. No acute intracranial hemorrhage identified. Gray-white matter differentiation appears stable. Evidence of chronic deep gray and white matter lacunar infarcts, as well is a small chronic cortically based infarct of the right cingulate gyrus (series 2, image 12). Calvarium and skull base: Calvarium intact. Paranasal sinuses: Visualized paranasal sinuses and mastoids are clear. Orbits: No scalp hematoma identified. No acute orbits soft tissue findings. CTA NECK Skeleton: Widespread cervical spine degeneration including multilevel spondylolisthesis. Widespread cervical facet hypertrophy. Bilateral posterior element alignment  is within normal limits. Cervicothoracic junction alignment is within normal limits. No acute osseous abnormality identified. Other neck: Negative visualized lung parenchyma. No superior mediastinal lymphadenopathy. Negative thyroid, sub cm hypodense nodule on the right  does not meet consensus criteria for ultrasound follow-up. Larynx, pharynx (motion artifact), parapharyngeal spaces, retropharyngeal space, sublingual space, submandibular glands, and parotid glands are within normal limits. No cervical lymphadenopathy. Aortic arch: 3 vessel arch configuration. Moderate soft and calcified arch atherosclerosis. Right carotid system: No brachiocephalic or right CCA origin stenosis. Tortuous proximal right CCA. Widely patent right carotid bifurcation with no significant atherosclerosis. Motion artifact at the distal right ICA bulb. Otherwise negative cervical right ICA. Left carotid system: Calcified plaque at the left CCA origin but no stenosis. Tortuous left CCA with a partially retropharyngeal course. Minimal plaque at the left carotid bifurcation but more substantial calcified plaque in the distal posterior left ICA bulb. Still, stenosis appears less than 50 % with respect to the distal vessel. Motion artifact just distal to this plaque. Tortuous cervical left ICA with a kinked appearance at the C1 level, and again just below the skullbase. Vertebral arteries:No proximal right subclavian artery stenosis despite calcified plaque. Normal right vertebral artery origin. Non dominant right vertebral artery is intermittently tortuous and otherwise negative to the skullbase. Calcified plaque in the proximal left subclavian artery without significant stenosis. Calcified plaque at the left vertebral artery origin without significant stenosis. Dominant left vertebral artery is tortuous in the V1 segment. Additional proximal the 2 calcified plaque without stenosis. Tortuous V2 segment. No significant stenosis to the skullbase. CTA HEAD Posterior circulation: Dominant distal left vertebral artery. No distal vertebral artery stenosis common the non dominant right vertebral functionally terminates in PICA. Normal PICA origins. No basilar artery stenosis. Normal SCA and right PCA origins. Fetal  type left PCA origin. Diminutive right posterior communicating artery. Left PCA branches are within normal limits. There is mild to moderate stenosis in the right PCA P2 segment best seen on series 13 image 13. Otherwise negative right PCA branches. Anterior circulation: Both ICA siphons are patent with mild tortuosity. Minimal left calcified plaque. More calcified plaque on the right, most pronounced in the supraclinoid segment, but no hemodynamically significant stenosis results. Normal left posterior communicating artery origin, the proximal to that there is a small left ICA superior high apophyseal infundibulum or aneurysm measuring 1-2 mm (series 12, image 78). Patent carotid termini. Normal MCA and ACA origins. Dominant right ACA A1 segment . Extensive ACA atherosclerosis and stenosis. Occluded left ACA A2 segment with distal reconstituted flow, but continued high-grade irregularity and multifocal high-grade distal left ACA stenoses. Irregularity with a high-grade stenosis in the azygos A2 segment (series 13, image 14) with preserved distal flow, but then additional moderate irregularity in the right ACA branches. Bilateral MCA M1 segments, bifurcations, and MCA branches are normal aside from mild bilateral in 3 segment irregularity. Venous sinuses: Patent. Anatomic variants: Dominant left vertebral artery. Fetal type left PCA origin. Dominant right ACA A1 segment with azygos type A2 anatomy. Delayed phase: Mild venous related enhancement suspected on series 15, image 20 in the right ACA subcortical white matter. No definite abnormal intracranial enhancement. IMPRESSION: 1. No significant arterial stenosis in the neck. Tortuous carotid arteries with intermittent calcified plaque but no significant carotid artery stenosis. 2. Negative for emergent large vessel occlusion, but positive for segmental occlusion of the left ACA A2 segment. Also High-grade stenosis in the right ACA A2 segment with preserved distal  flow. 3. Lesser posterior  circulation atherosclerosis, with mild to moderate right PCA P2 segment stenosis. 4. 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 5. No acute cortically based infarct identified by CT. No intracranial hemorrhage. 6. Chronic basal ganglia, cerebral white matter, and right ACA territory ischemia. Electronically Signed: By: Genevie Ann M.D. On: 10/15/2015 13:47     Assessment/Plan:  82 y.o. female with a known history of Hypertension, diabetes and hyperlipidemia. The patient has had the dizziness for a [place] days. She had been spinning sensation worse with positional changes. Pt has history of lwey body dementia.  Improved this AM. On meclizine.  Imaging reviewed. Pt has significant atherosclerotic dz specifically in the anterior circulation.   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  - con't ASA - meclizine PRN - PT today and out pt PT if needed - d/c planning today - s/p discussion with patient and husband at bedside.  10/17/2015, 11:37 AM

## 2015-10-17 NOTE — Progress Notes (Signed)
[place] [facility]    MR#:  [id number]  DATE OF BIRTH:  [birth year]  SUBJECTIVE:  CHIEF COMPLAINT:   Chief Complaint  Patient presents with  . Fall   Dizziness still present. Waiting for neurology and physical therapy to see the patient. No other focal neurological deficits. REVIEW OF SYSTEMS:    Review of Systems  Constitutional: Positive for malaise/fatigue. Negative for fever and chills.  HENT: Positive for hearing loss. Negative for sore throat.   Eyes: Negative for blurred vision, double vision and pain.  Respiratory: Negative for cough, hemoptysis, shortness of breath and wheezing.   Cardiovascular: Negative for chest pain, palpitations, orthopnea and leg swelling.  Gastrointestinal: Negative for heartburn, nausea, vomiting, abdominal pain, diarrhea and constipation.  Genitourinary: Negative for dysuria and hematuria.  Musculoskeletal: Negative for back pain and joint pain.  Skin: Negative for rash.  Neurological: Positive for dizziness. Negative for sensory change, speech change, focal weakness and headaches.  Endo/Heme/Allergies: Does not bruise/bleed easily.  Psychiatric/Behavioral: Negative for depression. The patient is not nervous/anxious.     DRUG ALLERGIES:   Allergies  Allergen Reactions  . Ceftin [Cefuroxime Axetil] Nausea And Vomiting  . Codeine Nausea And Vomiting  . Metformin Diarrhea  . Paxil [Paroxetine Hcl] Other (See Comments)    Reaction:  Unknown   . Prozac [Fluoxetine Hcl] Other (See Comments)    Reaction:  Dizziness     VITALS:  Blood pressure 154/56, pulse 68, temperature 97.7 F (36.5 C), temperature source Axillary, resp. rate 20, height 5\' 3"  (1.6 m), weight 57.017 kg (125 lb 11.2 oz), SpO2 97 %.  PHYSICAL EXAMINATION:   Physical Exam  GENERAL:  82 y.o.-year-old patient lying in the bed with no acute distress. Decreased hearing EYES: Pupils equal, round, reactive  to light and accommodation. No scleral icterus. Extraocular muscles intact.  HEENT: Head atraumatic, normocephalic. Oropharynx and nasopharynx clear.  NECK:  Supple, no jugular venous distention. No thyroid enlargement, no tenderness.  LUNGS: Normal breath sounds bilaterally, no wheezing, rales, rhonchi. No use of accessory muscles of respiration.  CARDIOVASCULAR: S1, S2 normal. No murmurs, rubs, or gallops.  ABDOMEN: Soft, nontender, nondistended. Bowel sounds present. No organomegaly or mass.  EXTREMITIES: No cyanosis, clubbing or edema b/l.    NEUROLOGIC: Cranial nerves II through XII are intact. No focal Motor or sensory deficits b/l.   PSYCHIATRIC: The patient is alert and awake SKIN: No obvious rash, lesion, or ulcer.   LABORATORY PANEL:   CBC  Recent Labs Lab 10/16/15 0433  WBC 8.5  HGB 10.8*  HCT 32.4*  PLT 346   ------------------------------------------------------------------------------------------------------------------ Chemistries   Recent Labs Lab 10/14/15 1610  10/16/15 0433  NA 135  < > 137  K 3.3*  < > 3.5  CL 110  < > 107  CO2 19*  < > 25  GLUCOSE 148*  < > 139*  BUN 18  < > 21*  CREATININE 0.64  < > 0.85  CALCIUM 7.8*  < > 8.5*  AST 16  --   --   ALT 12*  --   --   ALKPHOS 54  --   --   BILITOT 0.4  --   --   < > = values in this interval not displayed. ------------------------------------------------------------------------------------------------------------------  Cardiac Enzymes  Recent Labs Lab 10/14/15 1610  TROPONINI <0.03   ------------------------------------------------------------------------------------------------------------------  RADIOLOGY:  No results found.   ASSESSMENT AND PLAN:   The patient  will be placed for observation.  * vertigo Continue Antivert, fall precaution. Physical therapy consult.  * Bilateral ACA stenosis, with the right side high-grade stenosis. Asa, Statin Discussed with Dr. Irish Elders of  neurology who will be seeing the patient.  * Dehydration. Start IV fluid support and follow-up BMP.  * Accelerated hypertension. Continue hypertension medications with hydralazine IV when necessary.  * Diabetes. Start sliding scale. Continue Lantus.  All the records are reviewed and case discussed with Care Management/Social Workerr. Management plans discussed with the patient, family and they are in agreement.  CODE STATUS: FULL  DVT Prophylaxis: SCDs  TOTAL TIME TAKING CARE OF THIS PATIENT: 35 minutes.   POSSIBLE D/C IN 1-2 DAYS, DEPENDING ON CLINICAL CONDITION.  Hillary Bow R M.D on 10/17/2015 at 2:54 PM  Between 7am to 6pm - Pager - [phone]  After 6pm go to [email or web address] - password [email or web address] [facility]  Office  [phone]  CC: Primary care physician; Ezequiel Kayser, MD  Note: This dictation was prepared with Dragon dictation along with smaller phrase technology. Any transcriptional errors that result from this process are unintentional.

## 2015-10-17 NOTE — Discharge Summary (Signed)
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 Cancer ([place])     skin ca  . Chronic kidney disease   . GERD (gastroesophageal reflux disease)   . Bipolar disorder ([place])   . Anxiety   . Hypertension   . Diabetes mellitus without complication ([place])   . Hyperlipidemia   . Proteinuria   . Allergic rhinitis   . Benign essential tremor   . Anemia   . Vitamin B 12 deficiency   . Cognitive impairment   . UTI (urinary tract infection)   . SVT (supraventricular tachycardia) ([place])   . Cystocele   . Cystic disease of breast   . Diverticulosis   . Depression   . Wears dentures     partial lower     ADMITTING HISTORY  Sharon Dickson is a 82 y.o. female with a known history of Hypertension, diabetes and hyperlipidemia. The patient has had the dizziness for a Hume days. She had been spinning sensation worse with positional changes. She also has some generalized weakness. He came to the ED yesterday with a CAT scan which didn't show any acute process. Patient has still has dizziness and fell again today. She hated her head on the past and there was bleeding from her posterior scalp. She denies any loss of consciousness or seizure. Denies any slurred speech or dysphagia, numbness or tingling. CT angiogram of the head show Bilateral ACA stenosis. Dr. Mariea Clonts request admitting the patient.  HOSPITAL COURSE:   The patient will be placed for  observation.  * vertigo Continue Antivert, fall precaution. Improved well and almost resolved  * Bilateral ACA stenosis, with the right side high-grade stenosis- on CTA Asa, Statin Discussed with Dr. Irish Elders of neurology. Advice for dose aspirin. No acute intervention needed.  * Dehydration. Start IV fluid support and follow-up BMP. Resolved  * Accelerated hypertension. Added Norvasc to patient's home medications. Blood pressure improved well and is presently at 154/90. Follow-up with PCP.  * Diabetes. Start sliding scale. Continue Lantus.  Stable for discharge home to follow-up with primary care physician. Advised to use walker at all times to prevent future falls.  CONSULTS OBTAINED:  Treatment Team:  Leotis Pain, MD  DRUG ALLERGIES:   Allergies  Allergen Reactions  . Ceftin [Cefuroxime Axetil] Nausea And Vomiting  . Codeine Nausea And Vomiting  . Metformin Diarrhea  . Paxil [Paroxetine Hcl] Other (See Comments)    Reaction:  Unknown   . Prozac [Fluoxetine Hcl] Other (See Comments)    Reaction:  Dizziness     DISCHARGE MEDICATIONS:   Discharge Medication List as of 10/17/2015  2:05 PM    START taking these medications   Details  amLODipine (NORVASC) 10 MG tablet Take 1 tablet (10 mg total) by mouth daily., Starting 10/17/2015, Until Discontinued, Print    aspirin EC 325 MG EC tablet Take 1 tablet (325 mg total) by mouth daily., Starting 10/17/2015, Until Discontinued, Print  CONTINUE these medications which have CHANGED   Details  meclizine (ANTIVERT) 25 MG tablet Take 1 tablet (25 mg total) by mouth 3 (three) times daily as needed for dizziness or nausea., Starting 10/16/2015, Until Discontinued, Normal      CONTINUE these medications which have NOT CHANGED   Details  Biotin 1 MG CAPS Take 1 mg by mouth daily. , Until Discontinued, Historical Med    cholecalciferol (VITAMIN D) 1000 UNITS tablet Take 1,000 Units by mouth daily., Until Discontinued,  Historical Med    cyanocobalamin (,VITAMIN B-12,) 1000 MCG/ML injection Inject 1 mL into the muscle every 30 (thirty) days., Starting 12/16/2014, Until Sun 12/11/15, Historical Med    DUREZOL 0.05 % EMUL Place 1 drop into the left eye every morning., Starting 09/21/2015, Until Discontinued, Historical Med    fluticasone (FLONASE) 50 MCG/ACT nasal spray Place 2 sprays into both nostrils daily as needed for rhinitis. , Until Discontinued, Historical Med    insulin aspart protamine- aspart (NOVOLOG MIX 70/30) (70-30) 100 UNIT/ML injection Inject 14 Units into the skin daily with supper., Until Discontinued, Historical Med    insulin glargine (LANTUS) 100 UNIT/ML injection Inject 14 Units into the skin at bedtime., Until Discontinued, Historical Med    ketorolac (ACULAR) 0.4 % SOLN Place 1 drop into the left eye every morning., Starting 09/21/2015, Until Discontinued, Historical Med    losartan (COZAAR) 50 MG tablet Take 50 mg by mouth daily., Until Discontinued, Historical Med    metFORMIN (GLUCOPHAGE-XR) 500 MG 24 hr tablet Take 500 mg by mouth See admin instructions. Take 2 tablets (1000mg ) by mouth in the morning and take 1 tablet by mouth in the evening., Until Discontinued, Historical Med    multivitamin-iron-minerals-folic acid (CENTRUM) chewable tablet Chew 1 tablet by mouth daily., Until Discontinued, Historical Med    omeprazole (PRILOSEC) 20 MG capsule Take 20 mg by mouth at bedtime. , Until Discontinued, Historical Med    ondansetron (ZOFRAN ODT) 4 MG disintegrating tablet Take 1 tablet (4 mg total) by mouth every 8 (eight) hours as needed for nausea or vomiting., Starting 10/14/2015, Until Discontinued, Print    primidone (MYSOLINE) 50 MG tablet Take 25 mg by mouth 2 (two) times daily. , Until Discontinued, Historical Med    propranolol (INDERAL) 40 MG tablet Take 40 mg by mouth 2 (two) times daily., Until Discontinued, Historical Med    venlafaxine XR (EFFEXOR-XR) 75 MG 24 hr capsule  Take 75-150 mg by mouth See admin instructions. Take 2 capsules (150mg ) by mouth in the morning and Take 1 capsule by mouth every night at bedtime., Until Discontinued, Historical Med    vitamin E 400 UNIT capsule Take 400 Units by mouth daily., Until Discontinued, Historical Med        Today   VITAL SIGNS:  Blood pressure 154/56, pulse 68, temperature 97.7 F (36.5 C), temperature source Axillary, resp. rate 20, height 5\' 3"  (1.6 m), weight 57.017 kg (125 lb 11.2 oz), SpO2 97 %.  I/O:   Intake/Output Summary (Last 24 hours) at 10/17/15 1458 Last data filed at 10/17/15 1200  Gross per 24 hour  Intake   1400 ml  Output      0 ml  Net   1400 ml    PHYSICAL EXAMINATION:  Physical Exam  GENERAL:  82 y.o.-year-old patient lying in the bed with no acute distress. Decreased hearing LUNGS: Normal breath sounds bilaterally, no wheezing, rales,rhonchi or crepitation. No use of accessory muscles of respiration.  CARDIOVASCULAR: S1, S2 normal.  No murmurs, rubs, or gallops.  ABDOMEN: Soft, non-tender, non-distended. Bowel sounds present. No organomegaly or mass.  NEUROLOGIC: Moves all 4 extremities. PSYCHIATRIC: The patient is alert and awake SKIN: No obvious rash, lesion, or ulcer.   DATA REVIEW:   CBC  Recent Labs Lab 10/16/15 0433  WBC 8.5  HGB 10.8*  HCT 32.4*  PLT 346    Chemistries   Recent Labs Lab 10/14/15 1610  10/16/15 0433  NA 135  < > 137  K 3.3*  < > 3.5  CL 110  < > 107  CO2 19*  < > 25  GLUCOSE 148*  < > 139*  BUN 18  < > 21*  CREATININE 0.64  < > 0.85  CALCIUM 7.8*  < > 8.5*  AST 16  --   --   ALT 12*  --   --   ALKPHOS 54  --   --   BILITOT 0.4  --   --   < > = values in this interval not displayed.  Cardiac Enzymes  Recent Labs Lab 10/14/15 1610  TROPONINI <0.03    Microbiology Results  No results found for this or any previous visit.  RADIOLOGY:  No results found.  Follow up with PCP in 1 week.  Management plans discussed with  the patient, family and they are in agreement.  CODE STATUS:     Code Status Orders        Start     Ordered   10/15/15 1920  Full code   Continuous     10/15/15 1919    Code Status History    Date Active Date Inactive Code Status Order ID Comments User Context   04/13/2015  8:27 PM 04/15/2015  7:00 PM Full Code ID:[id number]  Demetrios Loll, MD Inpatient      TOTAL TIME TAKING CARE OF THIS PATIENT ON DAY OF DISCHARGE: more than 30 minutes.   Hillary Bow R M.D on 10/17/2015 at 2:58 PM  Between 7am to 6pm - Pager - [phone]  After 6pm go to [email or web address] - password [email or web address] [facility]  Office  [phone]  CC: Primary care physician; Ezequiel Kayser, MD  Note: This dictation was prepared with Dragon dictation along with smaller phrase technology. Any transcriptional errors that result from this process are unintentional.

## 2015-10-20 ENCOUNTER — Encounter: Payer: Medicare Other | Admitting: Physical Therapy

## 2015-10-24 DIAGNOSIS — R296 Repeated falls: Secondary | ICD-10-CM | POA: Insufficient documentation

## 2015-10-24 DIAGNOSIS — R2681 Unsteadiness on feet: Secondary | ICD-10-CM | POA: Insufficient documentation

## 2016-06-19 IMAGING — CT CT ABD-PELV W/ CM
2 of 5 series · 15 of 46 positions shown, 17 images · IV contrast (omnipaque)
Comparison: Ultrasound the abdomen 08/04/2009

CLINICAL DATA: Patient reports having colonoscopy 8 days ago.
Noticed today having very dark stool.

EXAM:
CT ABDOMEN AND PELVIS WITH CONTRAST
TECHNIQUE: Multidetector CT imaging of the abdomen and pelvis was performed
using the standard protocol following bolus administration of
intravenous contrast.
CONTRAST:  75mL OMNIPAQUE IOHEXOL 300 MG/ML  SOLN

[Series 2: routine abd pel with · axial · 0.68mm/px · z∈[-798,-378]mm · 12 of 94 slices shown, 14 images]
[im 5/94  soft-tissue]
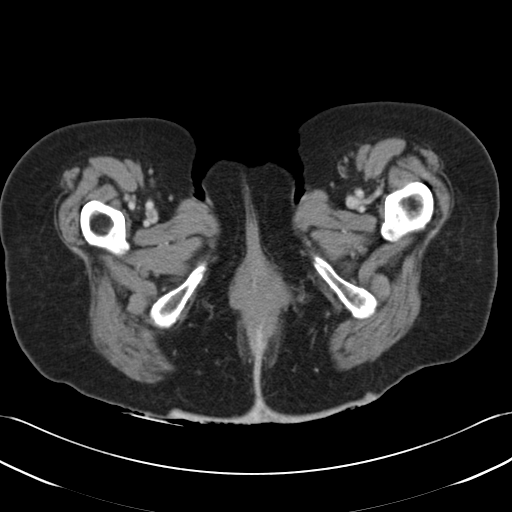
[im 5/94  bone]
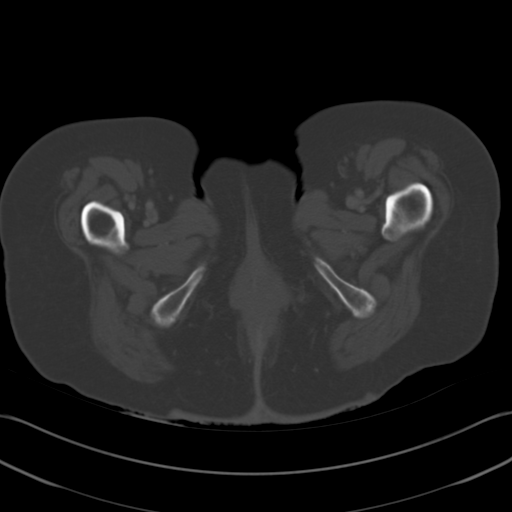
[im 15/94  soft-tissue]
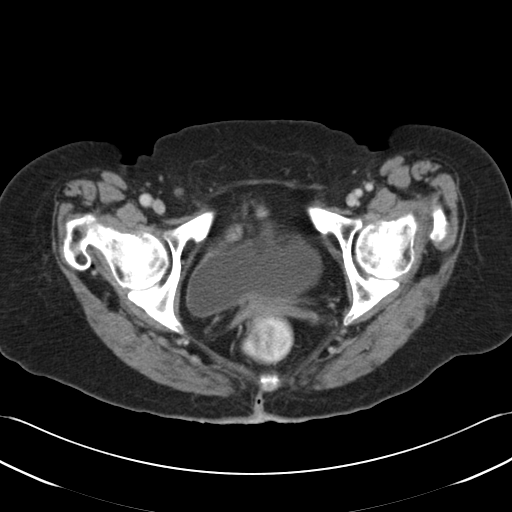
[im 20/94  soft-tissue]
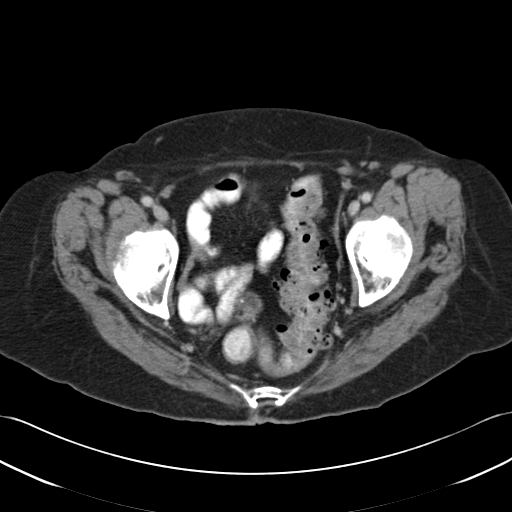
[im 30/94  soft-tissue]
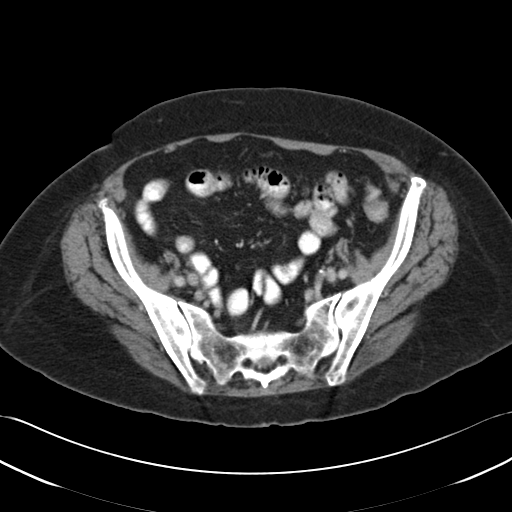
[im 35/94  soft-tissue]
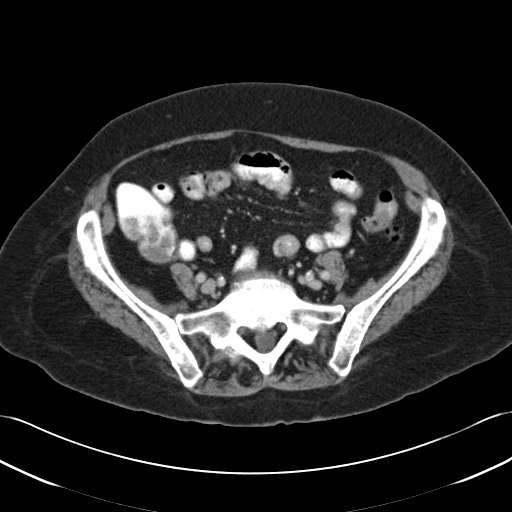
[im 45/94  soft-tissue]
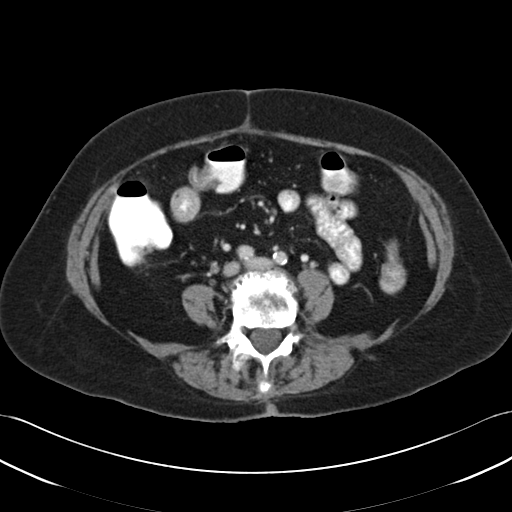
[im 49/94  soft-tissue]
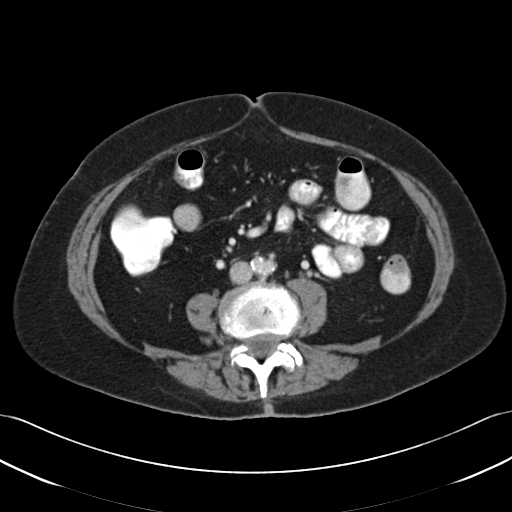
[im 59/94  soft-tissue]
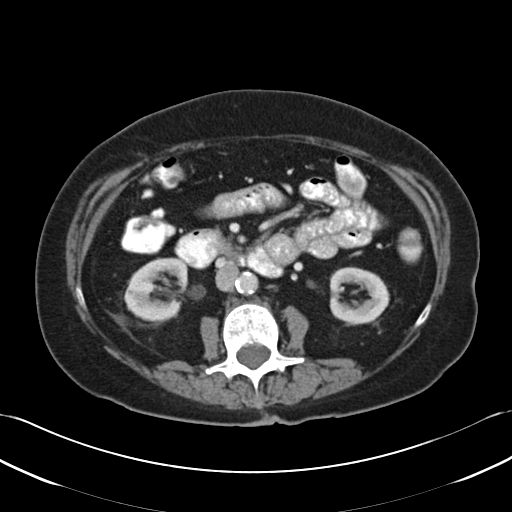
[im 64/94  soft-tissue]
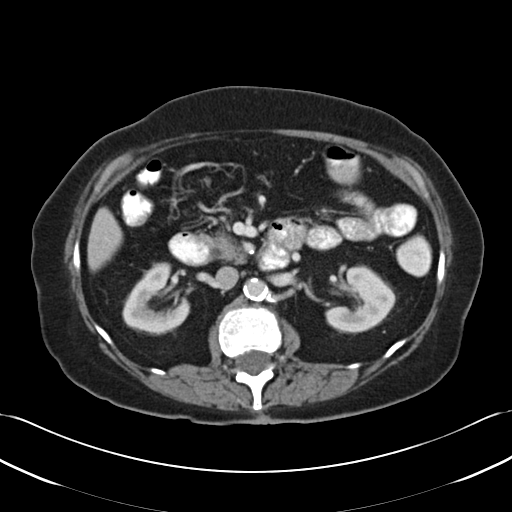
[im 64/94  bone]
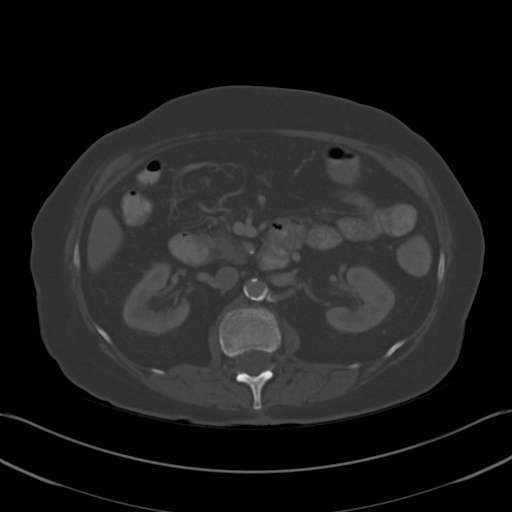
[im 74/94  soft-tissue]
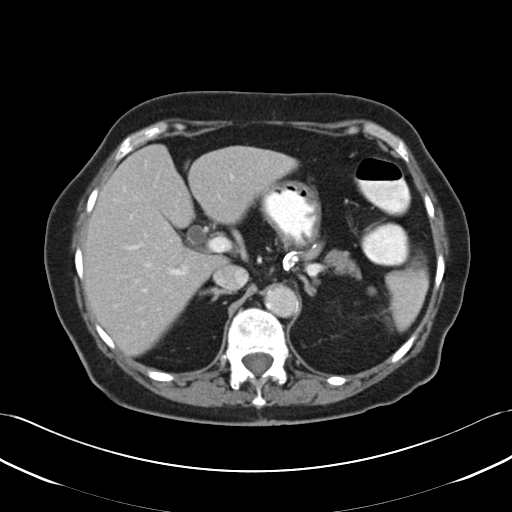
[im 79/94  soft-tissue]
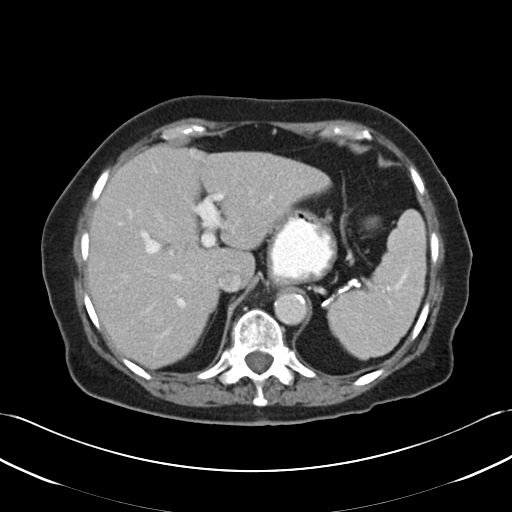
[im 89/94  soft-tissue]
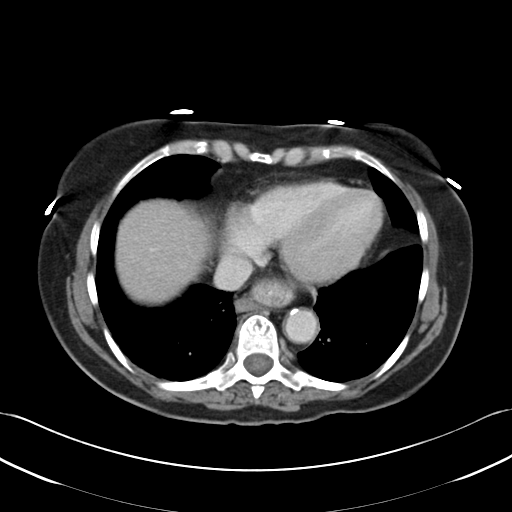

[Series 5: cor routine abd pel with · coronal · 0.62mm/px · 3 of 124 slices shown]
[im 42/124  soft-tissue]
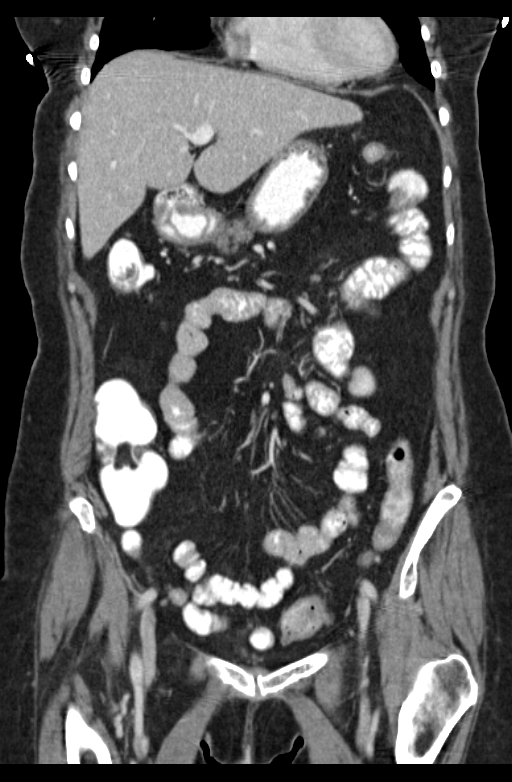
[im 55/124  soft-tissue]
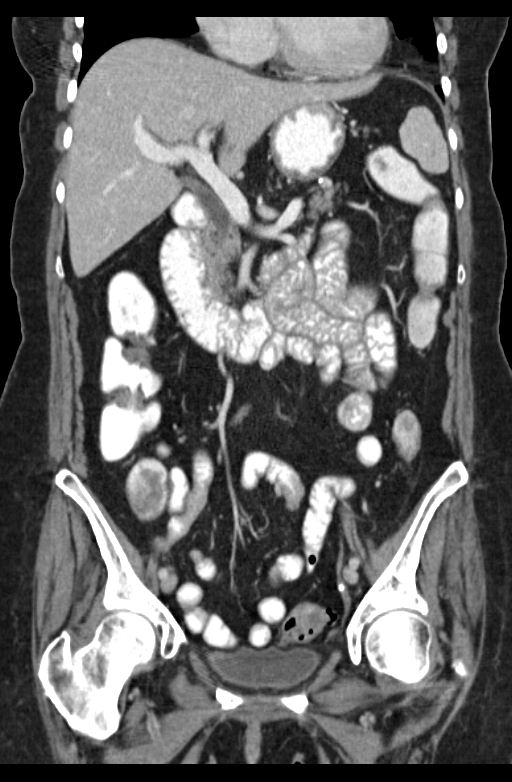
[im 69/124  soft-tissue]
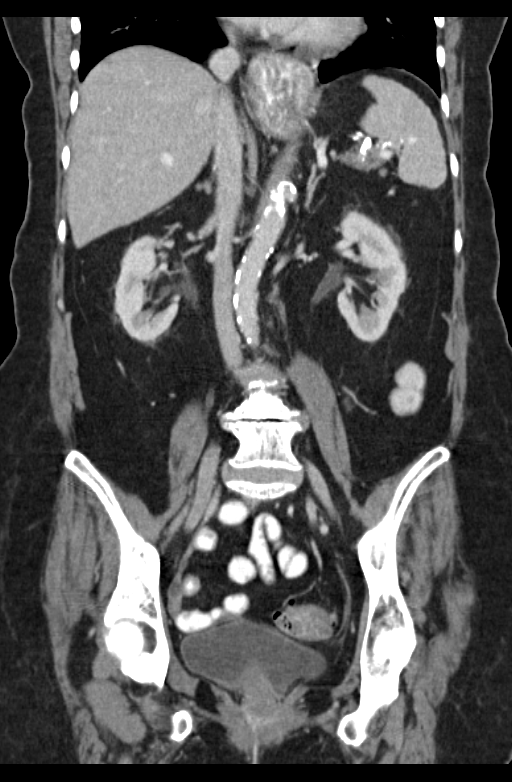

[15 of 46 positions shown; findings below may reference images not displayed]

FINDINGS: Lower chest: No pulmonary nodules, pleural effusions, or
infiltrates. Coronary artery calcifications are present. Hiatal
hernia is noted.

Upper abdomen: No focal abnormality identified within the liver. The
gallbladder is absent. Small low-attenuation lesions are identified
within the spleen which are too small to be characterized. No focal
abnormality identified within the pancreas or adrenal glands. Small
cyst is identified in the lower pole the right kidney. No
hydronephrosis.

Gastrointestinal tract: Hiatal hernia. Otherwise the stomach has a
normal appearance. Small bowel loops are normal in appearance.
Status post appendectomy. Numerous colonic diverticula are present.
No associated inflammation or obstruction.

Pelvis: Status post hysterectomy. A 1.1 cm benign-appearing right
adnexal mass is identified, likely ovarian cyst. No free pelvic
fluid.

Retroperitoneum: There is dense atherosclerotic calcification of the
tortuous abdominal aorta. No aneurysm. No retroperitoneal or
mesenteric adenopathy.

Abdominal wall: Unremarkable.

Osseous structures: Degenerative changes are seen in the lower
lumbar spine, particularly at L3-4 where there is disc protrusion.
There is disc protrusion, and disc height loss, and vacuum disc
phenomenon at L4-5. No suspicious lytic or blastic lesions are
identified.
IMPRESSION: 1. Diverticulosis without acute diverticulitis. No colonic mass
identified to account for the patient's symptoms.
2. Coronary artery disease.
3. Small splenic lesions are too small to characterize but favored
to be benign.
4. Small cysts within the lower pole the right kidney.
5. Hiatal hernia.
6. Status post appendectomy.
7. Right adnexal cyst 1.1 cm. Given the benign appearance and
size/menopausal status no further evaluation is necessary based on
consensus criteria.
8. Degenerative disc disease.
9. Atherosclerotic calcification of the abdominal aorta.

## 2016-06-27 ENCOUNTER — Other Ambulatory Visit: Payer: Self-pay | Admitting: Internal Medicine

## 2016-06-27 DIAGNOSIS — R1312 Dysphagia, oropharyngeal phase: Secondary | ICD-10-CM

## 2016-07-03 ENCOUNTER — Ambulatory Visit
Admission: RE | Admit: 2016-07-03 | Discharge: 2016-07-03 | Disposition: A | Discharge status: Home / Self Care | Payer: Medicare Other | Source: Ambulatory Visit | Attending: Internal Medicine | Admitting: Internal Medicine

## 2016-07-03 DIAGNOSIS — Z9049 Acquired absence of other specified parts of digestive tract: Secondary | ICD-10-CM | POA: Insufficient documentation

## 2016-07-03 DIAGNOSIS — E785 Hyperlipidemia, unspecified: Secondary | ICD-10-CM | POA: Diagnosis not present

## 2016-07-03 DIAGNOSIS — K210 Gastro-esophageal reflux disease with esophagitis: Secondary | ICD-10-CM | POA: Insufficient documentation

## 2016-07-03 DIAGNOSIS — D649 Anemia, unspecified: Secondary | ICD-10-CM | POA: Diagnosis not present

## 2016-07-03 DIAGNOSIS — F319 Bipolar disorder, unspecified: Secondary | ICD-10-CM | POA: Insufficient documentation

## 2016-07-03 DIAGNOSIS — N189 Chronic kidney disease, unspecified: Secondary | ICD-10-CM | POA: Diagnosis not present

## 2016-07-03 DIAGNOSIS — K219 Gastro-esophageal reflux disease without esophagitis: Secondary | ICD-10-CM | POA: Diagnosis not present

## 2016-07-03 DIAGNOSIS — R809 Proteinuria, unspecified: Secondary | ICD-10-CM | POA: Diagnosis not present

## 2016-07-03 DIAGNOSIS — F419 Anxiety disorder, unspecified: Secondary | ICD-10-CM | POA: Insufficient documentation

## 2016-07-03 DIAGNOSIS — N8110 Cystocele, unspecified: Secondary | ICD-10-CM | POA: Insufficient documentation

## 2016-07-03 DIAGNOSIS — I471 Supraventricular tachycardia: Secondary | ICD-10-CM | POA: Diagnosis not present

## 2016-07-03 DIAGNOSIS — R05 Cough: Secondary | ICD-10-CM | POA: Insufficient documentation

## 2016-07-03 DIAGNOSIS — F0280 Dementia in other diseases classified elsewhere without behavioral disturbance: Secondary | ICD-10-CM | POA: Insufficient documentation

## 2016-07-03 DIAGNOSIS — E538 Deficiency of other specified B group vitamins: Secondary | ICD-10-CM | POA: Diagnosis not present

## 2016-07-03 DIAGNOSIS — E1122 Type 2 diabetes mellitus with diabetic chronic kidney disease: Secondary | ICD-10-CM | POA: Insufficient documentation

## 2016-07-03 DIAGNOSIS — I129 Hypertensive chronic kidney disease with stage 1 through stage 4 chronic kidney disease, or unspecified chronic kidney disease: Secondary | ICD-10-CM | POA: Insufficient documentation

## 2016-07-03 DIAGNOSIS — R1312 Dysphagia, oropharyngeal phase: Secondary | ICD-10-CM | POA: Insufficient documentation

## 2016-07-03 DIAGNOSIS — G3183 Dementia with Lewy bodies: Secondary | ICD-10-CM | POA: Insufficient documentation

## 2016-07-03 NOTE — Therapy (Signed)
[place], [place], [address] Phone: [phone]   Fax:     Modified Barium Swallow  Patient Details  Name: Sharon Dickson MRN: [id number] Date of Birth: [birth year] No Data Recorded  Encounter Date: 07/03/2016      End of Session - 07/03/16 1331    Visit Number 1   Number of Visits 1   Date for SLP Re-Evaluation 07/03/16   SLP Start Time 14   SLP Stop Time  1330   SLP Time Calculation (min) 60 min   Activity Tolerance Patient tolerated treatment well      Past Medical History:  Diagnosis Date  . Allergic rhinitis   . Anemia   . Anxiety   . Benign essential tremor   . Bipolar disorder ([place])   . Cancer ([place])    skin ca  . Chronic kidney disease   . Cognitive impairment   . Cystic disease of breast   . Cystocele   . Depression   . Diabetes mellitus without complication ([place])   . Diverticulosis   . GERD (gastroesophageal reflux disease)   . Hyperlipidemia   . Hypertension   . Proteinuria   . SVT (supraventricular tachycardia) ([place])   . UTI (urinary tract infection)   . Vitamin B 12 deficiency   . Wears dentures    partial lower    Past Surgical History:  Procedure Laterality Date  . ABDOMINAL HYSTERECTOMY    . APPENDECTOMY    . BLADDER SURGERY  10/2008  . CATARACT EXTRACTION W/PHACO Right 08/03/2015   Procedure: CATARACT EXTRACTION PHACO AND INTRAOCULAR LENS PLACEMENT (IOC);  Surgeon: Leandrew Koyanagi, MD;  Location: [facility];  Service: Ophthalmology;  Laterality: Right;  DIABETIC - insulin  . CATARACT EXTRACTION W/PHACO Left 09/28/2015   Procedure: CATARACT EXTRACTION PHACO AND INTRAOCULAR LENS PLACEMENT (IOC);  Surgeon: Leandrew Koyanagi, MD;  Location: [facility];  Service: Ophthalmology;  Laterality: Left;  DIABETIC LEAVE PT 3RD  . CHOLECYSTECTOMY    . COLONOSCOPY  11/09/2014  . COLONOSCOPY WITH PROPOFOL N/A 04/05/2015   Procedure:  COLONOSCOPY WITH PROPOFOL;  Surgeon: Lollie Sails, MD;  Location[facility];  Service: Endoscopy;  Laterality: N/A;    There were no vitals filed for this visit.   Subjective: Patient behavior: (alertness, ability to follow instructions, etc.): pt was alert and oriented w/ fair-good attention to task; followed instructions w/ min cues. Verbally conversive and able to describe her deficits today. Pt does have a dx of Lewy Body Dementia w/out behavioral disturbance and is followed by Neurology in [facility]. Pt does have GERD and is on a PPI.  Chief complaint: dysphagia(w/ certain foods and a globus feeling high in throat); GERD baseline   Objective:  Radiological Procedure: A videoflouroscopic evaluation of oral-preparatory, reflex initiation, and pharyngeal phases of the swallow was performed; as well as a screening of the upper esophageal phase.  I. POSTURE: upright II. VIEW: lateral III. COMPENSATORY STRATEGIES: f/u, dry swallow (independently) IV. BOLUSES ADMINISTERED:  Thin Liquid: 4 trials  Nectar-thick Liquid: 1 trial  Honey-thick Liquid: NT  Puree: 3 trials  Mechanical Soft: 1 trial  Barium Tablet in Puree: x1 V. RESULTS OF EVALUATION: A. ORAL PREPARATORY PHASE: (The lips, tongue, and velum are observed for strength and coordination)       **Overall Severity Rating: grossly WFL. Slight-mild disorganized lingual control of bolus movement for A-P transfer but grossly wfl moreso w/ smaller bolus size trials. Also slight-min  oral residue w/ trials which cleared w/ an independent, f/u swallow. Suspect the oral phase issues could be related to her baseline dx of lewy body Dementia as well as dental work(residue).   B. SWALLOW INITIATION/REFLEX: (The reflex is normal if "triggered" by the time the bolus reached the base of the tongue)  **Overall Severity Rating: [facility]. All trials appeared to trigger the pharyngeal swallow initiation at the level of the BOT-Valleculae w/ adequate airway  closure noted during the swallow.   C. PHARYNGEAL PHASE: (Pharyngeal function is normal if the bolus shows rapid, smooth, and continuous transit through the pharynx and there is no pharyngeal residue after the swallow)  **Overall Severity Rating: [facility]. No significant pharyngeal residue noted post swallows. Adequate clearing indicating adequate laryngeal excursion and pharyngeal pressure during the swallow.  D. LARYNGEAL PENETRATION: (Material entering into the laryngeal inlet/vestibule but not aspirated): None E. ASPIRATION: None F. ESOPHAGEAL PHASE: (Screening of the upper esophagus): min slower bolus motility w/ increased textures through the upper, cervical Esophageal area(viewable); adequate clearing.   ASSESSMENT: Pt appeared to present w/ an adequate oropharyngeal phase swallow function w/ no gross oropharyngeal phase dysphagia noted during this evaluation. Pt exhibited slight-mild, disorganized lingual control of bolus movement for A-P transfer; slight-min oral residue remained which cleared w/ an independent, f/u swallow(not cued). Suspect oral phase issues could be related to her baseline dx of lewy-body Dementia. Timing of the pharyngeal swallow and the pharyngeal clearing of residue post swallow was adequate and wfl. Noted min slower bolus motility w/ increased textured trials(foods) through the upper, cervical Esophagus(viewable) though adequate clearing occurred w/ time given.  Educated given on using smaller boluses of foods/liquids for better oral control as well as easier Esophageal clearing; alternative foods/liquids as needed. Also encouraged continued use of f/u, dry swallow between bites/sips to clear any potential oral residue.    PLAN/RECOMMENDATIONS:  A. Diet: regular/mech soft; thin liquids  B. Swallowing Precautions: small bites/sips; eat/drink slowly; alternate foods/liquids; f/u, dry swallowing intermittently  C. Recommended consultation to: GI for any further assessment/tx  of Esophageal motility  D. Therapy recommendations: none indicated at this time  E. Results and recommendations were discussed w/ pt and husband present; video viewed w/ both. Recommendations agreed upon by both pt and husband.             Dysphagia, oropharyngeal - Plan: DG OP Swallowing Func-Medicare/Speech Path, DG OP Swallowing Func-Medicare/Speech Path      G-Codes - 07/29/16 1335    Functional Assessment Tool Used clinical judgement   Functional Limitations Swallowing   Swallow Current Status [id number]) At least 1 percent but less than 20 percent impaired, limited or restricted   Swallow Goal Status [id number]) At least 1 percent but less than 20 percent impaired, limited or restricted   Swallow Discharge Status [phone]) At least 1 percent but less than 20 percent impaired, limited or restricted          Problem List Patient Active Problem List   Diagnosis Date Noted  . Dizziness 10/15/2015  . Melena   . GIB (gastrointestinal bleeding) 04/13/2015  . Anemia 04/13/2015      Orinda Kenner, MS, CCC-SLP  Sharon Dickson 07/29/2016, 1:37 PM  [facility] [place], [place], [address] Phone: [phone]   Fax:     Name: Sharon Dickson MRN: [id number] Date of Birth: 15-Jul-1934

## 2016-07-10 DIAGNOSIS — R1313 Dysphagia, pharyngeal phase: Secondary | ICD-10-CM | POA: Insufficient documentation

## 2016-12-13 ENCOUNTER — Other Ambulatory Visit: Payer: Self-pay | Admitting: Internal Medicine

## 2016-12-13 DIAGNOSIS — Z1231 Encounter for screening mammogram for malignant neoplasm of breast: Secondary | ICD-10-CM

## 2016-12-20 IMAGING — CT CT HEAD W/O CM
1 series · 16 of 29 positions shown, 20 images · non-contrast
Comparison: 02/18/2015

CLINICAL DATA: Dizziness, fall, head injury

EXAM:
CT HEAD WITHOUT CONTRAST
TECHNIQUE: Contiguous axial images were obtained from the base of the skull
through the vertex without intravenous contrast.

[Series 2: head wo · axial · 0.39mm/px · z∈[+322,+452]mm · 16 of 29 slices shown, 20 images]
[im 2/29  brain]
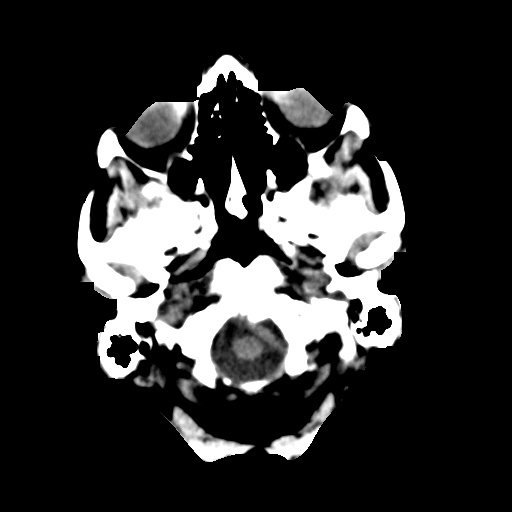
[im 2/29  bone]
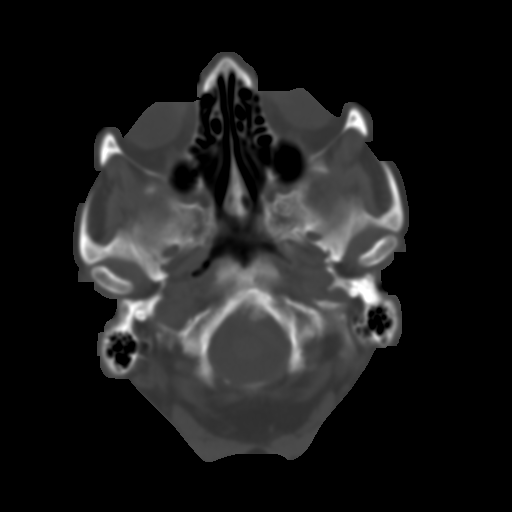
[im 4/29  brain]
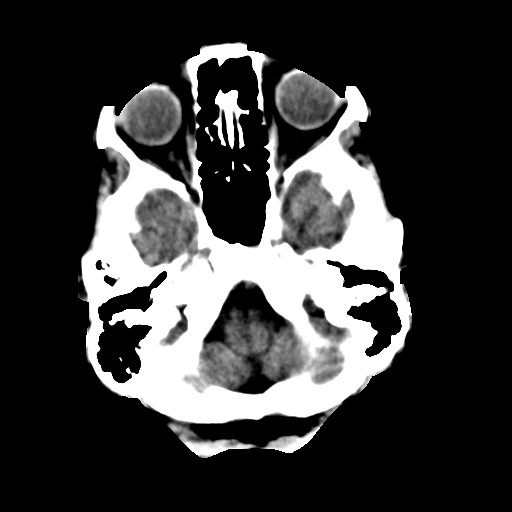
[im 6/29  brain]
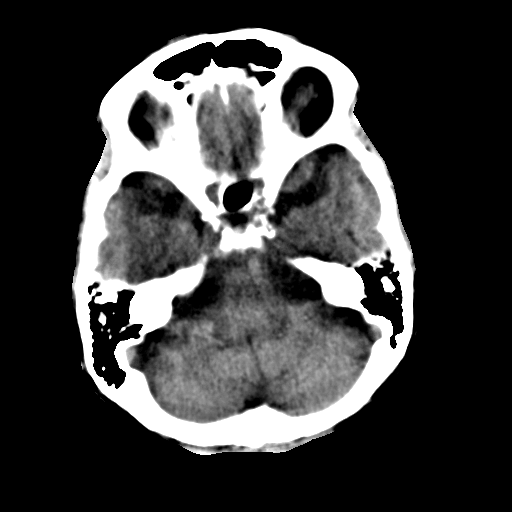
[im 7/29  brain]
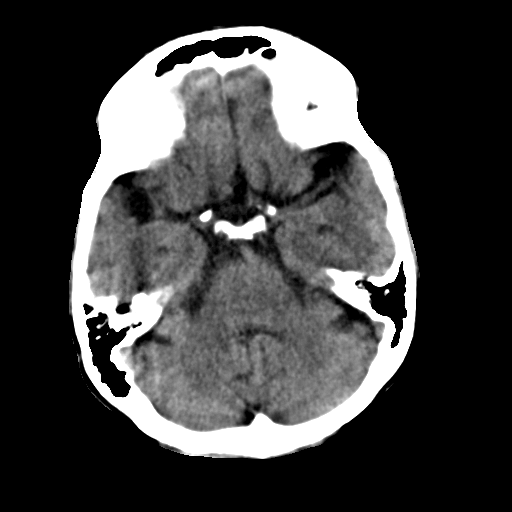
[im 9/29  brain]
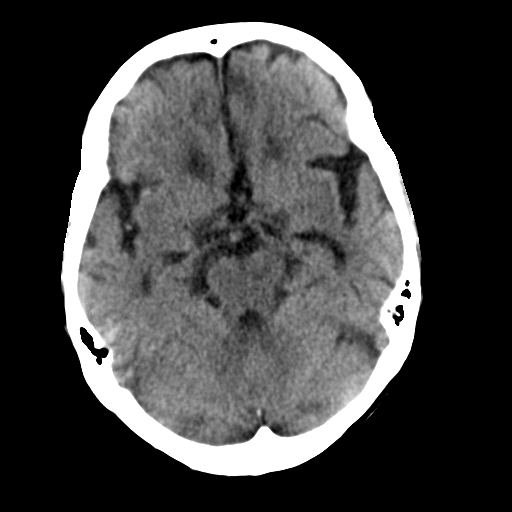
[im 9/29  bone]
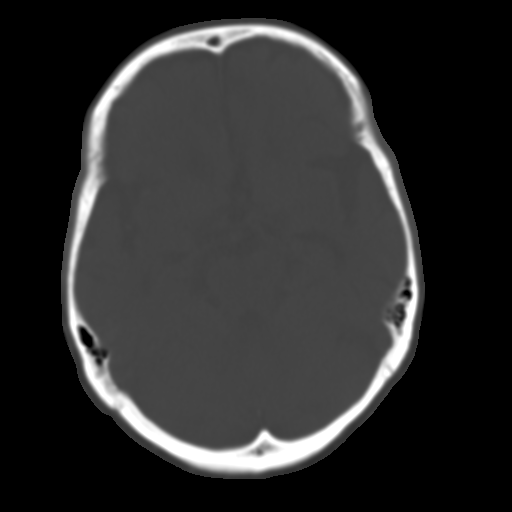
[im 11/29  brain]
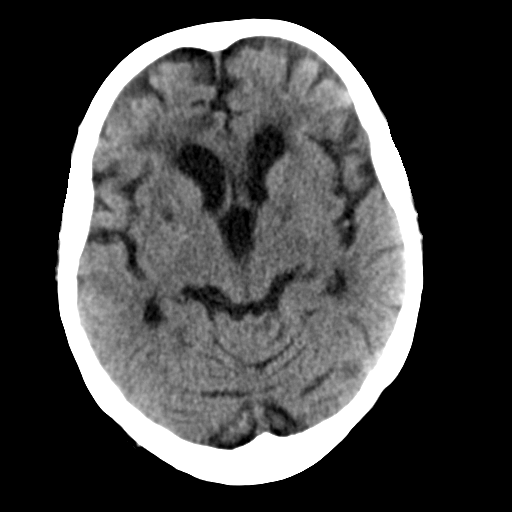
[im 12/29  brain]
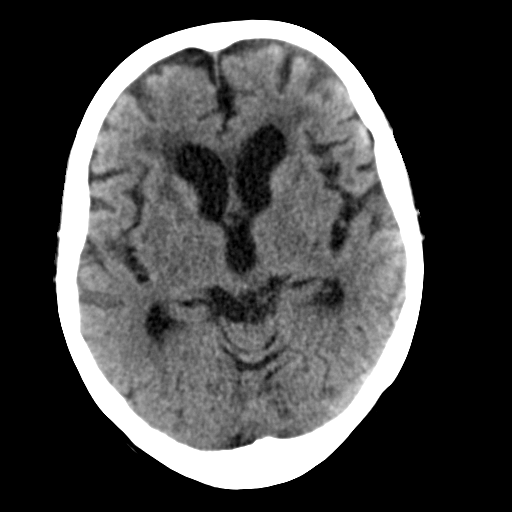
[im 14/29  brain]
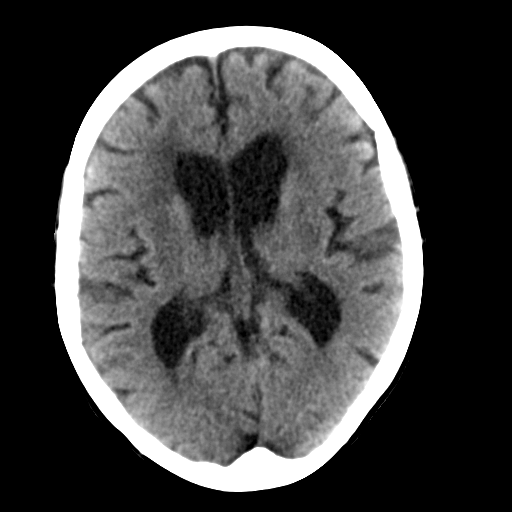
[im 16/29  brain]
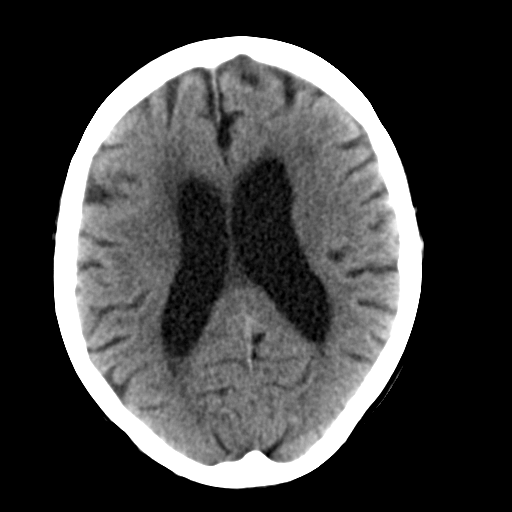
[im 16/29  bone]
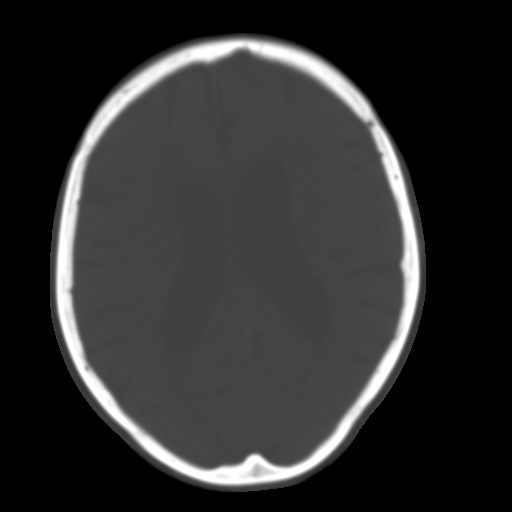
[im 18/29  brain]
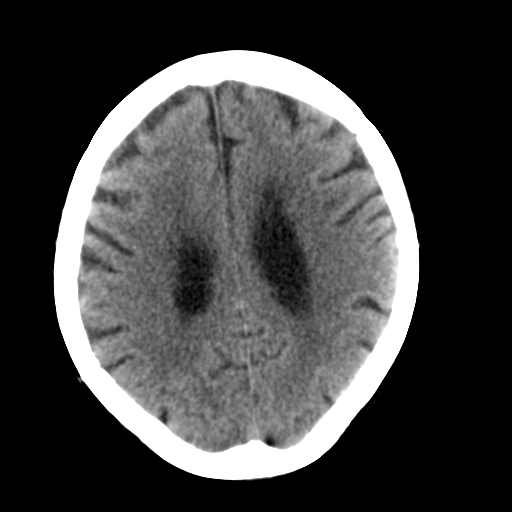
[im 19/29  brain]
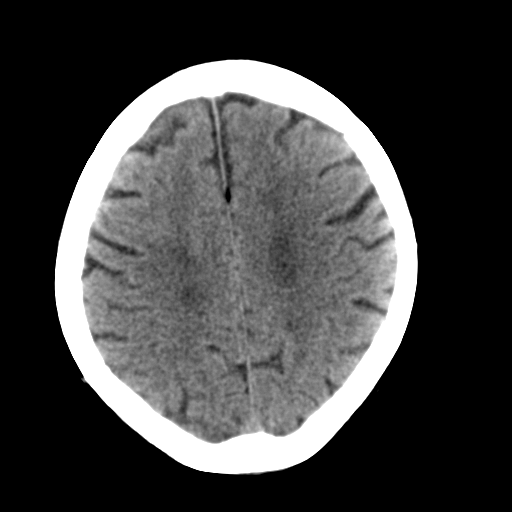
[im 21/29  brain]
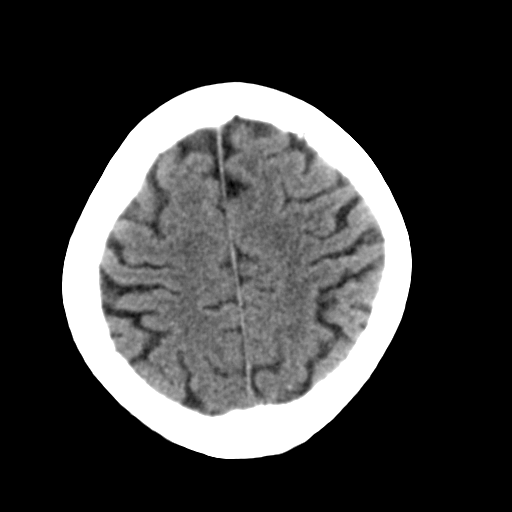
[im 23/29  brain]
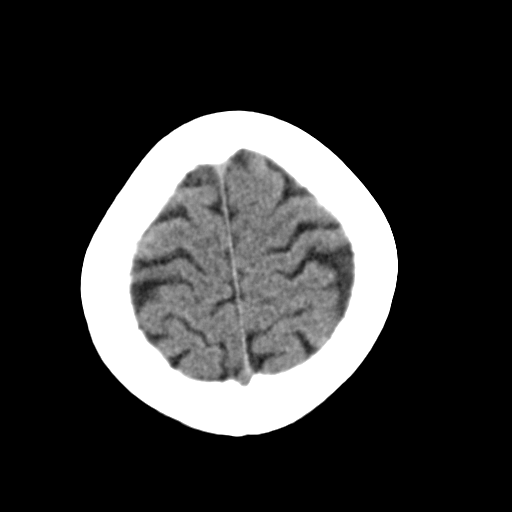
[im 23/29  bone]
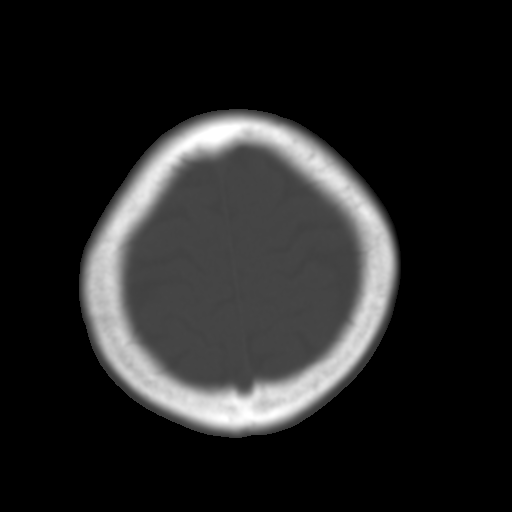
[im 24/29  brain]
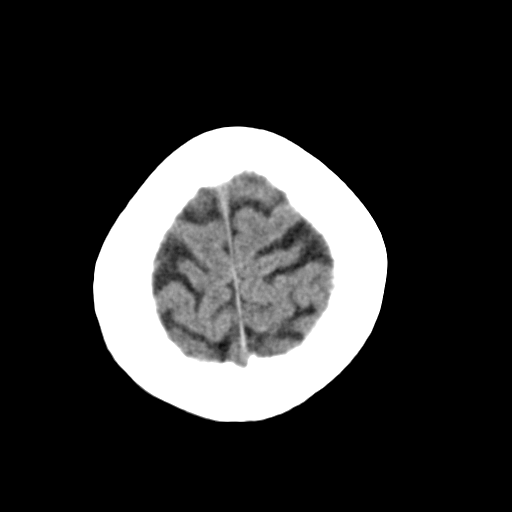
[im 26/29  brain]
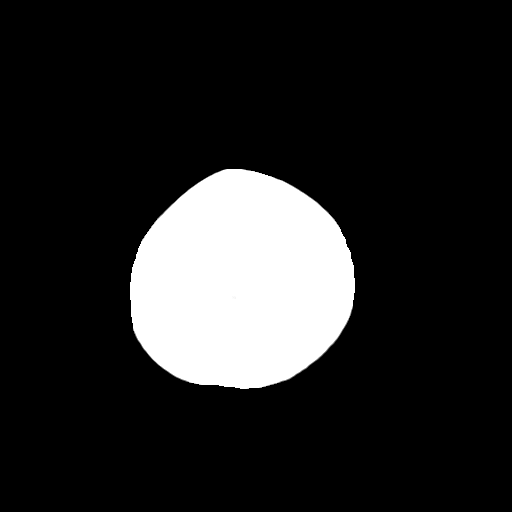
[im 28/29  brain]
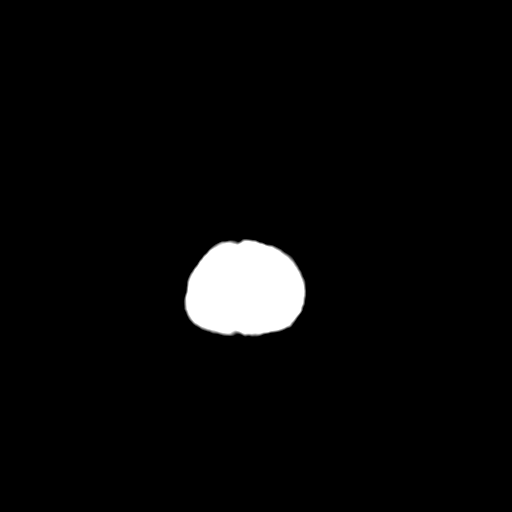

[16 of 29 positions shown; findings below may reference images not displayed]

FINDINGS: There is no evidence of mass effect, midline shift, or extra-axial
fluid collections. There is no evidence of a space-occupying lesion
or intracranial hemorrhage. There is no evidence of a cortical-based
area of acute infarction. There is generalized cerebral atrophy.
There is periventricular white matter low attenuation likely
secondary to microangiopathy.

The ventricles and sulci are appropriate for the patient's age. The
basal cisterns are patent.

Visualized portions of the orbits are unremarkable. The visualized
portions of the paranasal sinuses and mastoid air cells are
unremarkable. Cerebrovascular atherosclerotic calcifications are
noted.

The osseous structures are unremarkable.
IMPRESSION: 1. No acute intracranial pathology.
2. Chronic microvascular disease and cerebral atrophy.

## 2016-12-27 ENCOUNTER — Other Ambulatory Visit: Payer: Self-pay | Admitting: Unknown Physician Specialty

## 2016-12-27 DIAGNOSIS — M5441 Lumbago with sciatica, right side: Secondary | ICD-10-CM

## 2016-12-27 DIAGNOSIS — G8929 Other chronic pain: Secondary | ICD-10-CM | POA: Insufficient documentation

## 2017-01-03 ENCOUNTER — Ambulatory Visit
Admission: RE | Admit: 2017-01-03 | Discharge: 2017-01-03 | Disposition: A | Discharge status: Home / Self Care | Payer: Medicare Other | Source: Ambulatory Visit | Attending: Unknown Physician Specialty | Admitting: Unknown Physician Specialty

## 2017-01-03 DIAGNOSIS — M48061 Spinal stenosis, lumbar region without neurogenic claudication: Secondary | ICD-10-CM | POA: Diagnosis not present

## 2017-01-03 DIAGNOSIS — M5136 Other intervertebral disc degeneration, lumbar region: Secondary | ICD-10-CM | POA: Diagnosis not present

## 2017-01-03 DIAGNOSIS — M5441 Lumbago with sciatica, right side: Secondary | ICD-10-CM | POA: Insufficient documentation

## 2017-01-03 DIAGNOSIS — M5126 Other intervertebral disc displacement, lumbar region: Secondary | ICD-10-CM | POA: Diagnosis not present

## 2017-06-17 ENCOUNTER — Ambulatory Visit
Admission: RE | Admit: 2017-06-17 | Discharge: 2017-06-17 | Disposition: A | Discharge status: Home / Self Care | Payer: Medicare Other | Source: Ambulatory Visit | Attending: Internal Medicine | Admitting: Internal Medicine

## 2017-06-17 ENCOUNTER — Other Ambulatory Visit: Payer: Self-pay | Admitting: Internal Medicine

## 2017-06-17 DIAGNOSIS — S0990XA Unspecified injury of head, initial encounter: Secondary | ICD-10-CM

## 2017-06-17 DIAGNOSIS — X58XXXA Exposure to other specified factors, initial encounter: Secondary | ICD-10-CM | POA: Insufficient documentation

## 2017-06-17 DIAGNOSIS — G319 Degenerative disease of nervous system, unspecified: Secondary | ICD-10-CM | POA: Diagnosis not present

## 2017-08-09 ENCOUNTER — Other Ambulatory Visit: Payer: Self-pay | Admitting: Physical Medicine and Rehabilitation

## 2017-08-09 DIAGNOSIS — M5412 Radiculopathy, cervical region: Secondary | ICD-10-CM

## 2017-08-16 ENCOUNTER — Ambulatory Visit
Admission: RE | Admit: 2017-08-16 | Discharge: 2017-08-16 | Disposition: A | Discharge status: Home / Self Care | Payer: Medicare Other | Source: Ambulatory Visit | Attending: Physical Medicine and Rehabilitation | Admitting: Physical Medicine and Rehabilitation

## 2017-08-16 DIAGNOSIS — M4802 Spinal stenosis, cervical region: Secondary | ICD-10-CM | POA: Insufficient documentation

## 2017-08-16 DIAGNOSIS — M4722 Other spondylosis with radiculopathy, cervical region: Secondary | ICD-10-CM | POA: Insufficient documentation

## 2017-08-16 DIAGNOSIS — M50323 Other cervical disc degeneration at C6-C7 level: Secondary | ICD-10-CM | POA: Diagnosis not present

## 2017-08-16 DIAGNOSIS — M5412 Radiculopathy, cervical region: Secondary | ICD-10-CM

## 2017-08-19 DIAGNOSIS — M5030 Other cervical disc degeneration, unspecified cervical region: Secondary | ICD-10-CM | POA: Insufficient documentation

## 2017-08-27 ENCOUNTER — Ambulatory Visit
Admission: RE | Admit: 2017-08-27 | Discharge: 2017-08-27 | Disposition: A | Discharge status: Home / Self Care | Payer: Medicare Other | Source: Ambulatory Visit | Attending: Internal Medicine | Admitting: Internal Medicine

## 2017-08-27 DIAGNOSIS — Z1231 Encounter for screening mammogram for malignant neoplasm of breast: Secondary | ICD-10-CM | POA: Insufficient documentation

## 2017-11-22 ENCOUNTER — Other Ambulatory Visit: Payer: Self-pay | Admitting: Unknown Physician Specialty

## 2017-11-22 DIAGNOSIS — R1319 Other dysphagia: Secondary | ICD-10-CM

## 2017-12-09 ENCOUNTER — Ambulatory Visit
Admission: RE | Admit: 2017-12-09 | Discharge: 2017-12-09 | Disposition: A | Discharge status: Home / Self Care | Payer: Medicare Other | Source: Ambulatory Visit | Attending: Unknown Physician Specialty | Admitting: Unknown Physician Specialty

## 2017-12-09 DIAGNOSIS — R1310 Dysphagia, unspecified: Secondary | ICD-10-CM | POA: Insufficient documentation

## 2017-12-09 DIAGNOSIS — R1312 Dysphagia, oropharyngeal phase: Secondary | ICD-10-CM

## 2017-12-09 NOTE — Therapy (Signed)
[place], [place], [address] Phone: [phone]   Fax:     Modified Barium Swallow  Patient Details  Name: Sharon Dickson MRN: [id number] Date of Birth: Oct 27, 1933 No data recorded  Encounter Date: 12/09/2017  End of Session - 12/09/17 1339    Visit Number  1    Number of Visits  1    Date for SLP Re-Evaluation  12/09/17    SLP Start Time  31    SLP Stop Time   1330    SLP Time Calculation (min)  60 min    Activity Tolerance  Patient tolerated treatment well       Past Medical History:  Diagnosis Date  . Allergic rhinitis   . Anemia   . Anxiety   . Benign essential tremor   . Bipolar disorder ([place])   . Cancer ([place])    skin ca  . Chronic kidney disease   . Cognitive impairment   . Cystic disease of breast   . Cystocele   . Depression   . Diabetes mellitus without complication ([place])   . Diverticulosis   . GERD (gastroesophageal reflux disease)   . Hyperlipidemia   . Hypertension   . Proteinuria   . SVT (supraventricular tachycardia) ([place])   . UTI (urinary tract infection)   . Vitamin B 12 deficiency   . Wears dentures    partial lower    Past Surgical History:  Procedure Laterality Date  . ABDOMINAL HYSTERECTOMY    . APPENDECTOMY    . BLADDER SURGERY  10/2008  . CATARACT EXTRACTION W/PHACO Right 08/03/2015   Procedure: CATARACT EXTRACTION PHACO AND INTRAOCULAR LENS PLACEMENT (IOC);  Surgeon: Leandrew Koyanagi, MD;  Location: [facility];  Service: Ophthalmology;  Laterality: Right;  DIABETIC - insulin  . CATARACT EXTRACTION W/PHACO Left 09/28/2015   Procedure: CATARACT EXTRACTION PHACO AND INTRAOCULAR LENS PLACEMENT (IOC);  Surgeon: Leandrew Koyanagi, MD;  Location: [facility];  Service: Ophthalmology;  Laterality: Left;  DIABETIC LEAVE PT 3RD  . CHOLECYSTECTOMY    . COLONOSCOPY  11/09/2014  . COLONOSCOPY WITH PROPOFOL N/A 04/05/2015   Procedure:  COLONOSCOPY WITH PROPOFOL;  Surgeon: Lollie Sails, MD;  Location: [facility] ENDOSCOPY;  Service: Endoscopy;  Laterality: N/A;    There were no vitals filed for this visit.    Subjective: Patient behavior: (alertness, ability to follow instructions, etc.): the patient is able to follow directions with cues  Chief complaint: Patient with complaint of globus.  The patient had a MBS in December 2017 with minimal oral disorganization and pharyngeal stage within functional limits.   Objective:  Radiological Procedure: A videoflouroscopic evaluation of oral-preparatory, reflex initiation, and pharyngeal phases of the swallow was performed; as well as a screening of the upper esophageal phase.  I. POSTURE: Upright in MBS chair  II. VIEW: Lateral  III. COMPENSATORY STRATEGIES: N/A  IV. BOLUSES ADMINISTERED:   Thin Liquid: 1 single, 3 rapid consecutive   Nectar-thick Liquid: 1 moderate   Honey-thick Liquid: DNT   Puree: 2 teaspoon presentations   Mechanical Soft: 1/4 graham cracker in applesauce  V. RESULTS OF EVALUATION: A. ORAL PREPARATORY PHASE: (The lips, tongue, and velum are observed for strength and coordination)       **Overall Severity Rating: Mild; disorganized posterior transfer, labial/lingual tremor, coating residue post swallow  B. SWALLOW INITIATION/REFLEX: (The reflex is normal if "triggered" by the time the bolus reached the base of the tongue)  **Overall  Severity Rating: Mild; triggers at the valleculae and while falling from the valleculae to the pyriform sinuses  C. PHARYNGEAL PHASE: (Pharyngeal function is normal if the bolus shows rapid, smooth, and continuous transit through the pharynx and there is no pharyngeal residue after the swallow)  **Overall Severity Rating: Mild; reduced tongue base retraction with trace-to-mild vallecular residue  D. LARYNGEAL PENETRATION: (Material entering into the laryngeal inlet/vestibule but not aspirated) None  E. ASPIRATION:  None  F. ESOPHAGEAL PHASE: (Screening of the upper esophagus): In the cervical esophagus there is a finger-like protrusion along the posterior wall during swallow (does not impede flow of boluses) consistent with prominent cricopharyngeus.    ASSESSMENT: This 84 year old woman; with Lewy Body Dementia; is presenting with mild oropharyngeal dysphagia characterized by disorganized oral management, labial/lingual tremor, delayed pharyngeal swallow initiation, decreased tongue base retraction, and minimal vallecular residue post swallow.  There is no observed laryngeal penetration or tracheal aspiration.  Although the patient is maintaining good airway safety, this study reflects mild worsening of oropharyngeal swallow from previous study 06/2016 including increased oral stage difficulty, delayed swallow initiation, and minimal vallecular residue post swallow.  The patient remains at minimal risk for prandial aspiration and should continue with recommendations generated at that study- small bites/sips, alternate solid/liquid, and intermittent dry swallow.  PLAN/RECOMMENDATIONS:   A. Diet: Regular/mechanical soft with thin liquids   B. Swallowing Precautions: mall bites/sips, alternate solid/liquid, and intermittent dry swallow.   C. Recommended consultation to: follow up with MDs as scheulded   D. Therapy recommendations: speech therapy is not indicated at this time   E. Results and recommendations were discussed with the patient immediately following the study and the final report routed to the referring MD.    Oropharyngeal dysphagia - Plan: DG OP Swallowing Func-Medicare/Speech Path, DG OP Swallowing Func-Medicare/Speech Path        Problem List Patient Active Problem List   Diagnosis Date Noted  . Dizziness 10/15/2015  . Melena   . GIB (gastrointestinal bleeding) 04/13/2015  . Anemia 04/13/2015   Leroy Sea, MS/CCC- SLP  Lou Miner 12/09/2017, 1:40 PM  [facility] [place], [place], [address] Phone: [phone]   Fax:     Name: Sharon Dickson MRN: [id number] Date of Birth: [birth year]

## 2017-12-17 ENCOUNTER — Ambulatory Visit
Admission: RE | Admit: 2017-12-17 | Discharge: 2017-12-17 | Disposition: A | Discharge status: Home / Self Care | Payer: Medicare Other | Source: Ambulatory Visit | Attending: Internal Medicine | Admitting: Internal Medicine

## 2017-12-17 ENCOUNTER — Other Ambulatory Visit: Payer: Self-pay | Admitting: Internal Medicine

## 2017-12-17 DIAGNOSIS — S0990XA Unspecified injury of head, initial encounter: Secondary | ICD-10-CM

## 2017-12-17 DIAGNOSIS — R9082 White matter disease, unspecified: Secondary | ICD-10-CM | POA: Diagnosis not present

## 2017-12-24 ENCOUNTER — Encounter: Payer: Self-pay | Admitting: Physical Therapy

## 2017-12-24 ENCOUNTER — Ambulatory Visit: Payer: Medicare Other | Attending: Internal Medicine | Admitting: Physical Therapy

## 2017-12-24 DIAGNOSIS — R269 Unspecified abnormalities of gait and mobility: Secondary | ICD-10-CM | POA: Insufficient documentation

## 2017-12-24 DIAGNOSIS — Z9181 History of falling: Secondary | ICD-10-CM | POA: Insufficient documentation

## 2017-12-24 DIAGNOSIS — M6281 Muscle weakness (generalized): Secondary | ICD-10-CM | POA: Diagnosis present

## 2017-12-26 ENCOUNTER — Ambulatory Visit: Payer: Medicare Other | Admitting: Physical Therapy

## 2017-12-26 DIAGNOSIS — M6281 Muscle weakness (generalized): Secondary | ICD-10-CM

## 2017-12-26 DIAGNOSIS — Z9181 History of falling: Secondary | ICD-10-CM

## 2017-12-26 DIAGNOSIS — R269 Unspecified abnormalities of gait and mobility: Secondary | ICD-10-CM | POA: Diagnosis not present

## 2017-12-28 ENCOUNTER — Encounter: Payer: Self-pay | Admitting: Physical Therapy

## 2017-12-28 NOTE — Therapy (Signed)
[place] [facility] [address] [place], [place], [address] Phone: [phone]   Fax:  [phone]  Physical Therapy Evaluation  Patient Details  Name: Sharon Dickson MRN: [id number] Date of Birth: [birth year] Referring Provider: Dr. Raechel Ache   Encounter Date: 12/24/2017  PT End of Session - 12/28/17 1506    Visit Number  1    Number of Visits  8    Date for PT Re-Evaluation  01/21/18    PT Start Time  1107    PT Stop Time  1201    PT Time Calculation (min)  54 min    Equipment Utilized During Treatment  Gait belt    Activity Tolerance  Patient tolerated treatment well    Behavior During Therapy  [facility] for tasks assessed/performed       Past Medical History:  Diagnosis Date  . Allergic rhinitis   . Anemia   . Anxiety   . Benign essential tremor   . Bipolar disorder ([place])   . Cancer ([place])    skin ca  . Chronic kidney disease   . Cognitive impairment   . Cystic disease of breast   . Cystocele   . Depression   . Diabetes mellitus without complication ([place])   . Diverticulosis   . GERD (gastroesophageal reflux disease)   . Hyperlipidemia   . Hypertension   . Proteinuria   . SVT (supraventricular tachycardia) ([place])   . UTI (urinary tract infection)   . Vitamin B 12 deficiency   . Wears dentures    partial lower    Past Surgical History:  Procedure Laterality Date  . ABDOMINAL HYSTERECTOMY    . APPENDECTOMY    . BLADDER SURGERY  10/2008  . CATARACT EXTRACTION W/PHACO Right 08/03/2015   Procedure: CATARACT EXTRACTION PHACO AND INTRAOCULAR LENS PLACEMENT (IOC);  Surgeon: Leandrew Koyanagi, MD;  Location: [facility];  Service: Ophthalmology;  Laterality: Right;  DIABETIC - insulin  . CATARACT EXTRACTION W/PHACO Left 09/28/2015   Procedure: CATARACT EXTRACTION PHACO AND INTRAOCULAR LENS PLACEMENT (IOC);  Surgeon: Leandrew Koyanagi, MD;  Location: [facility] [place];  Service: Ophthalmology;  Laterality: Left;   DIABETIC LEAVE PT 3RD  . CHOLECYSTECTOMY    . COLONOSCOPY  11/09/2014  . COLONOSCOPY WITH PROPOFOL N/A 04/05/2015   Procedure: COLONOSCOPY WITH PROPOFOL;  Surgeon: Lollie Sails, MD;  Location: [facility] ENDOSCOPY;  Service: Endoscopy;  Laterality: N/A;    There were no vitals filed for this visit.   Pt. reports recent fall last Friday (posterior) over a water hose. No injury reported.       [facility] PT Assessment - 12/28/17 0001      Assessment   Medical Diagnosis  Gait disorder    Referring Provider  Dr. Raechel Ache    Onset Date/Surgical Date  07/23/17    Prior Therapy  yes      Balance Screen   Has the patient fallen in the past 6 months  Yes    How many times?  3+    Has the patient had a decrease in activity level because of a fear of falling?   Yes    Is the patient reluctant to leave their home because of a fear of falling?   No      Prior Function   Level of Independence  Independent         See HEP/ standing hip flexion/ abd.  (handouts provided).     PT Long Term Goals - 12/28/17  [place] #1   Title  Pt. I with HEP to increase B hip/LE strength to 4+/5 MMT to improve gait/balance.      Baseline  bilateral hip flexion 4/5 MMT.  Good quad/hamstring strength.    Time  4    Period  Weeks    Status  New    Target Date  01/21/18      PT LONG TERM GOAL #2   Title  Pt. will increase Berg balance test to >50/56 to promote safety/ decrease fall risk.     Baseline  46/56 on 6/4    Time  4    Period  Weeks    Status  New    Target Date  01/21/18      PT LONG TERM GOAL #3   Title  Pt. able to ambulate community distance with appropriate assistive device and no LOB safely.      Baseline  pt. ambulates with difficulty sequencing/ loss of balance with turning.  Pt. has h/o posterior LOB/ falls.     Time  4    Period  Weeks    Status  New    Target Date  01/21/18      PT LONG TERM GOAL #4   Title  Pt. will report no LOB or falls consistently for 1  week to promote greater independence with daily mobility.      Baseline  fall last week.     Time  4    Period  Weeks    Status  New    Target Date  01/21/18         Pt. is an 84 y/o female with reports of recent fall over water hose at home.  Pt. has h/o falls and has been evaluated by PT in past with limited compliance due to memory/ cognitive issues.  Pt. reports no pain or injury from recent fall but is concerned about falling again.  Pt. presents with B hip/LE AROM WNL and strength grossly 5/5 MMT except hip flexion 4/5 MMT.  Pt. confused about simple questions concerning recent events/ falls and unable to provide details.  Berg balance test: 46/56.  Pt. ambulates with slow, antalgic gait pattern with several LOB (backwards) while standing and talking or turning at end of hall.  Pt. benefits from [place] for safety with walking.  Pt. instructed in use of SPC and 2-point gait pattern but unable to understand sequencing/ proper use of SPC.  Pt. hesitant to use SPC due to reports of the [facility] making her feel/look old.  Pt. will benefit from skilled PT services to increase B LE muscle strength and gait training with appropriate assistive device to promote safety/ decease fall risk.       Patient will benefit from skilled therapeutic intervention in order to improve the following deficits and impairments:  Abnormal gait, Decreased strength, Improper body mechanics, Postural dysfunction, Difficulty walking, Decreased activity tolerance, Decreased mobility, Decreased balance, Decreased cognition, Decreased coordination, Decreased endurance  Visit Diagnosis: Gait difficulty  History of falling  Muscle weakness (generalized)     Problem List Patient Active Problem List   Diagnosis Date Noted  . Dizziness 10/15/2015  . Melena   . GIB (gastrointestinal bleeding) 04/13/2015  . Anemia 04/13/2015   Pura Spice, PT, DPT # [phone] 12/28/2017, 3:16 PM  [facility] [address] [place], [place], [address] Phone: [phone]   Fax:  [phone]  Name: Sharon Dickson MRN: [id number] Date of Birth: Dec 15, 1933

## 2017-12-28 NOTE — Therapy (Signed)
[facility] [address] [place], [place], [address] Phone: [phone]   Fax:  [phone]  Physical Therapy Treatment  Patient Details  Name: Sharon Dickson MRN: [id number] Date of Birth: [birth year] Referring Provider: Dr. Raechel Ache   Encounter Date: 12/26/2017  PT End of Session - 12/28/17 1620    Visit Number  2    Number of Visits  8    Date for PT Re-Evaluation  01/21/18    PT Start Time  1026    PT Stop Time  1122    PT Time Calculation (min)  56 min    Equipment Utilized During Treatment  Gait belt    Activity Tolerance  Patient tolerated treatment well    Behavior During Therapy  [facility] for tasks assessed/performed       Past Medical History:  Diagnosis Date  . Allergic rhinitis   . Anemia   . Anxiety   . Benign essential tremor   . Bipolar disorder ([place])   . Cancer ([place])    skin ca  . Chronic kidney disease   . Cognitive impairment   . Cystic disease of breast   . Cystocele   . Depression   . Diabetes mellitus without complication ([place])   . Diverticulosis   . GERD (gastroesophageal reflux disease)   . Hyperlipidemia   . Hypertension   . Proteinuria   . SVT (supraventricular tachycardia) ([place])   . UTI (urinary tract infection)   . Vitamin B 12 deficiency   . Wears dentures    partial lower    Past Surgical History:  Procedure Laterality Date  . ABDOMINAL HYSTERECTOMY    . APPENDECTOMY    . BLADDER SURGERY  10/2008  . CATARACT EXTRACTION W/PHACO Right 08/03/2015   Procedure: CATARACT EXTRACTION PHACO AND INTRAOCULAR LENS PLACEMENT (IOC);  Surgeon: Leandrew Koyanagi, MD;  Location: [facility];  Service: Ophthalmology;  Laterality: Right;  DIABETIC - insulin  . CATARACT EXTRACTION W/PHACO Left 09/28/2015   Procedure: CATARACT EXTRACTION PHACO AND INTRAOCULAR LENS PLACEMENT (IOC);  Surgeon: Leandrew Koyanagi, MD;  Location: [place];  Service: Ophthalmology;  Laterality: Left;   DIABETIC LEAVE PT 3RD  . CHOLECYSTECTOMY    . COLONOSCOPY  11/09/2014  . COLONOSCOPY WITH PROPOFOL N/A 04/05/2015   Procedure: COLONOSCOPY WITH PROPOFOL;  Surgeon: Lollie Sails, MD;  Location: [facility] ENDOSCOPY;  Service: Endoscopy;  Laterality: N/A;    There were no vitals filed for this visit.  Subjective Assessment - 12/28/17 1619    Subjective  Pt. reports no falls since initial evaluation.  Pt. confused about ex. program and PT reviewed.  Pt. states she is still not interested in using a SPC.      Pertinent History  Pt. known to PT clinic.  Pt. has a rollator but hasn't used in a while.      Patient Stated Goals  increase B LE muscle strength/ improve balance/ gait.     Currently in Pain?  No/denies         [facility] PT Assessment - 12/28/17 0001      Assessment   Medical Diagnosis  Gait disorder    Referring Provider  Dr. Raechel Ache    Onset Date/Surgical Date  07/23/17    Prior Therapy  yes      Balance Screen   Has the patient fallen in the past 6 months  Yes    How many times?  3+    Has the patient had  a decrease in activity level because of a fear of falling?   Yes    Is the patient reluctant to leave their home because of a fear of falling?   No      Prior Function   Level of Independence  Independent         Treatment:  There.ex.:   Nustep L6 10 min. B UE/LE (no issues or rest breaks). Standing hip ex.: flexion/ abd./ extension/ partial squats/ heel raises 20x each  Neuro.:  Step touches/ ups on 6 inch step 20x in //-bars. Walking in PT clinic with consistent step pattern/ BOS and moderate cuing for proper upright posture.   High marching in //-bars/ backwards walking/ tandem stance/ walking Hallway alt. UE/LE touches/ head turning/ EC walking. Walking outside with/without use of SPC  (pt. Not interested in using SPC at home).     PT Long Term Goals - 12/28/17 1511      PT LONG TERM GOAL #1   Title  Pt. I with HEP to increase B hip/LE strength to 4+/5  MMT to improve gait/balance.      Baseline  bilateral hip flexion 4/5 MMT.  Good quad/hamstring strength.    Time  4    Period  Weeks    Status  New    Target Date  01/21/18      PT LONG TERM GOAL #2   Title  Pt. will increase Berg balance test to >50/56 to promote safety/ decrease fall risk.     Baseline  46/56 on 6/4    Time  4    Period  Weeks    Status  New    Target Date  01/21/18      PT LONG TERM GOAL #3   Title  Pt. able to ambulate community distance with appropriate assistive device and no LOB safely.      Baseline  pt. ambulates with difficulty sequencing/ loss of balance with turning.  Pt. has h/o posterior LOB/ falls.     Time  4    Period  Weeks    Status  New    Target Date  01/21/18      PT LONG TERM GOAL #4   Title  Pt. will report no LOB or falls consistently for 1 week to promote greater independence with daily mobility.      Baseline  fall last week.     Time  4    Period  Weeks    Status  New    Target Date  01/21/18            Plan - 12/28/17 1621    Clinical Impression Statement  Pt. had 2 LOB during balance/ hallway tasks requiring CGA/min. A to correct and prevent fall.  Pt. tends to lean posteriorly with prolonged static standing/ EC tasks.  Pt. benefits from use of SPC with outside walking but requires cuing for 2-point gait/ proper sequencing.  2 short seated rest breaks during tx. session.        Clinical Presentation  Evolving    Clinical Decision Making  Moderate    Rehab Potential  Good    PT Frequency  2x / week    PT Duration  4 weeks    PT Treatment/Interventions  ADLs/Self Care Home Management;Patient/family education;Neuromuscular re-education;Balance training;Therapeutic exercise;Therapeutic activities;Functional mobility training;Stair training;Gait training;DME Instruction    PT Next Visit Plan  Issue progressive HEP.         Patient will benefit from  skilled therapeutic intervention in order to improve the following deficits  and impairments:  Abnormal gait, Decreased strength, Improper body mechanics, Postural dysfunction, Difficulty walking, Decreased activity tolerance, Decreased mobility, Decreased balance, Decreased cognition, Decreased coordination, Decreased endurance  Visit Diagnosis: Gait difficulty  History of falling  Muscle weakness (generalized)     Problem List Patient Active Problem List   Diagnosis Date Noted  . Dizziness 10/15/2015  . Melena   . GIB (gastrointestinal bleeding) 04/13/2015  . Anemia 04/13/2015   Pura Spice, PT, DPT # [phone] 12/28/2017, 4:36 PM  [facility] [address] [place], [place], [address] Phone: [phone]   Fax:  [phone]  Name: Nisha Dhami [place] MRN: [id number] Date of Birth: [birth year]

## 2017-12-31 ENCOUNTER — Encounter: Payer: Self-pay | Admitting: Physical Therapy

## 2017-12-31 ENCOUNTER — Ambulatory Visit: Payer: Medicare Other | Admitting: Physical Therapy

## 2017-12-31 DIAGNOSIS — Z9181 History of falling: Secondary | ICD-10-CM

## 2017-12-31 DIAGNOSIS — R269 Unspecified abnormalities of gait and mobility: Secondary | ICD-10-CM

## 2017-12-31 DIAGNOSIS — M6281 Muscle weakness (generalized): Secondary | ICD-10-CM

## 2017-12-31 NOTE — Therapy (Signed)
[facility] [address] [place], [place], [address] Phone: [phone]   Fax:  [phone]  Physical Therapy Treatment  Patient Details  Name: Sharon Dickson MRN: [id number] Date of Birth: 08-Feb-1934 Referring Provider: Dr. Raechel Ache   Encounter Date: 12/31/2017  PT End of Session - 12/31/17 1252    Visit Number  3    Number of Visits  8    Date for PT Re-Evaluation  01/21/18    PT Start Time  1028    PT Stop Time  1124    PT Time Calculation (min)  56 min    Equipment Utilized During Treatment  Gait belt    Activity Tolerance  Patient tolerated treatment well    Behavior During Therapy  [facility] for tasks assessed/performed       Past Medical History:  Diagnosis Date  . Allergic rhinitis   . Anemia   . Anxiety   . Benign essential tremor   . Bipolar disorder ([place])   . Cancer ([place])    skin ca  . Chronic kidney disease   . Cognitive impairment   . Cystic disease of breast   . Cystocele   . Depression   . Diabetes mellitus without complication ([place])   . Diverticulosis   . GERD (gastroesophageal reflux disease)   . Hyperlipidemia   . Hypertension   . Proteinuria   . SVT (supraventricular tachycardia) ([place])   . UTI (urinary tract infection)   . Vitamin B 12 deficiency   . Wears dentures    partial lower    Past Surgical History:  Procedure Laterality Date  . ABDOMINAL HYSTERECTOMY    . APPENDECTOMY    . BLADDER SURGERY  10/2008  . CATARACT EXTRACTION W/PHACO Right 08/03/2015   Procedure: CATARACT EXTRACTION PHACO AND INTRAOCULAR LENS PLACEMENT (IOC);  Surgeon: Leandrew Koyanagi, MD;  Location: [place];  Service: Ophthalmology;  Laterality: Right;  DIABETIC - insulin  . CATARACT EXTRACTION W/PHACO Left 09/28/2015   Procedure: CATARACT EXTRACTION PHACO AND INTRAOCULAR LENS PLACEMENT (IOC);  Surgeon: Leandrew Koyanagi, MD;  Location: [place];  Service: Ophthalmology;  Laterality: Left;   DIABETIC LEAVE PT 3RD  . CHOLECYSTECTOMY    . COLONOSCOPY  11/09/2014  . COLONOSCOPY WITH PROPOFOL N/A 04/05/2015   Procedure: COLONOSCOPY WITH PROPOFOL;  Surgeon: Lollie Sails, MD;  Location: [facility] ENDOSCOPY;  Service: Endoscopy;  Laterality: N/A;    There were no vitals filed for this visit.  Subjective Assessment - 12/31/17 1044    Subjective  Pt. reports no falls over the weekend and was pretty active Sunday with church and going out to eat in [place].  Pt. reports that she was too "worn out" in order to do HEP.  Went to [employer] yesterday and was able to walk around outside without much difficulty.  Pt reports a little discomfort in back     Pertinent History  Pt. known to PT clinic.  Pt. has a rollator but hasn't used in a while.      Patient Stated Goals  increase B LE muscle strength/ improve balance/ gait.     Currently in Pain?  No/denies        There Ex:  Nustep for 14min to increase endurance of B LE.  Pt reports decrease back pain. Standing hip abduction/ extension/ calf raises/ partial squats 2 x15 Stair climbing with CGA and minimal usage of handrail. See HEP (handouts issued/ reviewed).     Neuro:  High marching in // bars (cuing for posture/ step pattern) Hallway walking with use of gait belt and CGA/modA for correction /head turns/ letter and number recall/ change in speed with verbal cueing Walking outside on grass, pavement and stepping onto curbs with CGA      PT Long Term Goals - 12/28/17 1511      PT LONG TERM GOAL #1   Title  Pt. I with HEP to increase B hip/LE strength to 4+/5 MMT to improve gait/balance.      Baseline  bilateral hip flexion 4/5 MMT.  Good quad/hamstring strength.    Time  4    Period  Weeks    Status  New    Target Date  01/21/18      PT LONG TERM GOAL #2   Title  Pt. will increase Berg balance test to >50/56 to promote safety/ decrease fall risk.     Baseline  46/56 on 6/4    Time  4    Period  Weeks     Status  New    Target Date  01/21/18      PT LONG TERM GOAL #3   Title  Pt. able to ambulate community distance with appropriate assistive device and no LOB safely.      Baseline  pt. ambulates with difficulty sequencing/ loss of balance with turning.  Pt. has h/o posterior LOB/ falls.     Time  4    Period  Weeks    Status  New    Target Date  01/21/18      PT LONG TERM GOAL #4   Title  Pt. will report no LOB or falls consistently for 1 week to promote greater independence with daily mobility.      Baseline  fall last week.     Time  4    Period  Weeks    Status  New    Target Date  01/21/18            Plan - 12/31/17 1136    Clinical Impression Statement  Pt. had difficulty following instructions today and required constant verbal cueing in order to perform ther ex properly.  Pt. was able to increase ability of recalling letters and numbers on the wall during hallway walks.  With increased cadence from verbal cueing, pt. demonstrated decreased stability with 2 LOB during treatment with CGA/minA. to correct gait.  During Nustep, patient required consistent verbal cueing to keep pedaling feet and seems disoriented at times.  Overall, pt. making improvements in stability with gait.  Patient sent home with HEP to work on hip strengthening exercises.    Clinical Presentation  Evolving    Clinical Decision Making  Moderate    Rehab Potential  Good    PT Frequency  2x / week    PT Duration  4 weeks    PT Treatment/Interventions  ADLs/Self Care Home Management;Patient/family education;Neuromuscular re-education;Balance training;Therapeutic exercise;Therapeutic activities;Functional mobility training;Stair training;Gait training;DME Instruction    PT Next Visit Plan  Continue functional/stability exercises to increase confidence levels and reasses HEP given today.       Patient will benefit from skilled therapeutic intervention in order to improve the following deficits and  impairments:  Abnormal gait, Decreased strength, Improper body mechanics, Postural dysfunction, Difficulty walking, Decreased activity tolerance, Decreased mobility, Decreased balance, Decreased cognition, Decreased coordination, Decreased endurance  Visit Diagnosis: Gait difficulty  History of falling  Muscle weakness (generalized)     Problem List Patient Active  Problem List   Diagnosis Date Noted  . Dizziness 10/15/2015  . Melena   . GIB (gastrointestinal bleeding) 04/13/2015  . Anemia 04/13/2015   Pura Spice, PT, DPT # [id number]  [place], SPT 12/31/2017, 1:11 PM  [place] [facility] [address] [place], [place], [address] Phone: [phone]   Fax:  [phone]  Name: Sharon Dickson MRN: [id number] Date of Birth: 01/07/1934

## 2018-01-02 ENCOUNTER — Encounter: Payer: Medicare Other | Admitting: Physical Therapy

## 2018-01-07 ENCOUNTER — Encounter: Payer: Self-pay | Admitting: Physical Therapy

## 2018-01-07 ENCOUNTER — Ambulatory Visit: Payer: Medicare Other | Admitting: Physical Therapy

## 2018-01-07 DIAGNOSIS — R269 Unspecified abnormalities of gait and mobility: Secondary | ICD-10-CM

## 2018-01-07 DIAGNOSIS — M6281 Muscle weakness (generalized): Secondary | ICD-10-CM

## 2018-01-07 DIAGNOSIS — Z9181 History of falling: Secondary | ICD-10-CM

## 2018-01-07 NOTE — Therapy (Signed)
[facility] [address] [place], [place], [address] Phone: [phone]   Fax:  [phone]  Physical Therapy Treatment  Patient Details  Name: Sharon Dickson MRN: [id number] Date of Birth: [birth year] Referring Provider: Dr. Raechel Ache   Encounter Date: 01/07/2018  PT End of Session - 01/07/18 1629    Visit Number  4    Number of Visits  8    Date for PT Re-Evaluation  01/21/18    PT Start Time  1026    PT Stop Time  1119    PT Time Calculation (min)  53 min    Equipment Utilized During Treatment  Gait belt    Activity Tolerance  Patient tolerated treatment well    Behavior During Therapy  [facility] for tasks assessed/performed       Past Medical History:  Diagnosis Date  . Allergic rhinitis   . Anemia   . Anxiety   . Benign essential tremor   . Bipolar disorder ([name])   . Cancer ([name])    skin ca  . Chronic kidney disease   . Cognitive impairment   . Cystic disease of breast   . Cystocele   . Depression   . Diabetes mellitus without complication ([name])   . Diverticulosis   . GERD (gastroesophageal reflux disease)   . Hyperlipidemia   . Hypertension   . Proteinuria   . SVT (supraventricular tachycardia) ([place])   . UTI (urinary tract infection)   . Vitamin B 12 deficiency   . Wears dentures    partial lower    Past Surgical History:  Procedure Laterality Date  . ABDOMINAL HYSTERECTOMY    . APPENDECTOMY    . BLADDER SURGERY  10/2008  . CATARACT EXTRACTION W/PHACO Right 08/03/2015   Procedure: CATARACT EXTRACTION PHACO AND INTRAOCULAR LENS PLACEMENT (IOC);  Surgeon: Leandrew Koyanagi, MD;  Location: [place];  Service: Ophthalmology;  Laterality: Right;  DIABETIC - insulin  . CATARACT EXTRACTION W/PHACO Left 09/28/2015   Procedure: CATARACT EXTRACTION PHACO AND INTRAOCULAR LENS PLACEMENT (IOC);  Surgeon: Leandrew Koyanagi, MD;  Location: [place];  Service: Ophthalmology;  Laterality: Left;   DIABETIC LEAVE PT 3RD  . CHOLECYSTECTOMY    . COLONOSCOPY  11/09/2014  . COLONOSCOPY WITH PROPOFOL N/A 04/05/2015   Procedure: COLONOSCOPY WITH PROPOFOL;  Surgeon: Lollie Sails, MD;  [facility] ENDOSCOPY;  Service: Endoscopy;  Laterality: N/A;    There were no vitals filed for this visit.  Subjective Assessment - 01/07/18 1550    Subjective  Pt. reports no falls, however has not been active due to stomach flu that she experienced over the weekend.  Pt. reports to still not be feeling great, but wanted to come to therapy.  Husband drove pt. to appointment due to her not feeling well.    Patient is accompained by:  Family member    Pertinent History  Pt. known to PT clinic.  Pt. has a rollator but hasn't used in a while.      Patient Stated Goals  increase B LE muscle strength/ improve balance/ gait.     Currently in Pain?  No/denies         Treatment:  There Ex:  Nustep for 31min to increase endurance of B LE (not billed)  Standing hip abduction/ extension/ calf raises/ partial squats 2 x20 Stair climbing with CGA and minimal usage of handrail.    Neuro:  Hallway walking with use of gait belt and CGA/modA for  correction /head turns/ letter and number recall/ change in speed with verbal cueing Walking outside on grass, pavement and stepping onto curbs with CGA Sit-to-stands at bed while standing on Aerex pad 2x20 (difficulty without LOB, uses posterior LEs against table to keep balance)     PT Long Term Goals - 12/28/17 1511      PT LONG TERM GOAL #1   Title  Pt. I with HEP to increase B hip/LE strength to 4+/5 MMT to improve gait/balance.      Baseline  bilateral hip flexion 4/5 MMT.  Good quad/hamstring strength.    Time  4    Period  Weeks    Status  New    Target Date  01/21/18      PT LONG TERM GOAL #2   Title  Pt. will increase Berg balance test to >50/56 to promote safety/ decrease fall risk.     Baseline  46/56 on 6/4    Time  4    Period  Weeks     Status  New    Target Date  01/21/18      PT LONG TERM GOAL #3   Title  Pt. able to ambulate community distance with appropriate assistive device and no LOB safely.      Baseline  pt. ambulates with difficulty sequencing/ loss of balance with turning.  Pt. has h/o posterior LOB/ falls.     Time  4    Period  Weeks    Status  New    Target Date  01/21/18      PT LONG TERM GOAL #4   Title  Pt. will report no LOB or falls consistently for 1 week to promote greater independence with daily mobility.      Baseline  fall last week.     Time  4    Period  Weeks    Status  New    Target Date  01/21/18            Plan - 01/07/18 1633    Clinical Impression Statement  Pt. had difficulty hearing today and asked to repeat instructions on several different occassions.  Pt. did well with ther ex progression to performing functional exercises such as sit-to-stands, and proprioception activities.  Pt. required slight corrections during ambulation activities outside.    Clinical Presentation  Evolving    Clinical Decision Making  Moderate    Rehab Potential  Good    PT Frequency  2x / week    PT Duration  4 weeks    PT Treatment/Interventions  ADLs/Self Care Home Management;Patient/family education;Neuromuscular re-education;Balance training;Therapeutic exercise;Therapeutic activities;Functional mobility training;Stair training;Gait training;DME Instruction    PT Next Visit Plan  Continue functional/stability exercises to increase confidence levels and reasses HEP given today.       Patient will benefit from skilled therapeutic intervention in order to improve the following deficits and impairments:  Abnormal gait, Decreased strength, Improper body mechanics, Postural dysfunction, Difficulty walking, Decreased activity tolerance, Decreased mobility, Decreased balance, Decreased cognition, Decreased coordination, Decreased endurance  Visit Diagnosis: Gait difficulty  History of  falling  Muscle weakness (generalized)     Problem List Patient Active Problem List   Diagnosis Date Noted  . Dizziness 10/15/2015  . Melena   . GIB (gastrointestinal bleeding) 04/13/2015  . Anemia 04/13/2015   Pura Spice, PT, DPT # [id number] Gwenlyn Saran, SPT 01/07/2018, 4:46 PM  [place] [address] [place], [place],  [address] Phone: [phone]   Fax:  [phone]  Name: Reeda Soohoo Routon MRN: [id number] Date of Birth: 1933-08-29

## 2018-01-09 ENCOUNTER — Encounter: Payer: Medicare Other | Admitting: Physical Therapy

## 2018-01-15 ENCOUNTER — Other Ambulatory Visit: Payer: Self-pay | Admitting: Gastroenterology

## 2018-01-15 DIAGNOSIS — R1312 Dysphagia, oropharyngeal phase: Secondary | ICD-10-CM

## 2018-01-21 ENCOUNTER — Ambulatory Visit
Admission: RE | Admit: 2018-01-21 | Discharge: 2018-01-21 | Disposition: A | Discharge status: Home / Self Care | Payer: Medicare Other | Source: Ambulatory Visit | Attending: Gastroenterology | Admitting: Gastroenterology

## 2018-01-21 DIAGNOSIS — K219 Gastro-esophageal reflux disease without esophagitis: Secondary | ICD-10-CM | POA: Diagnosis not present

## 2018-01-21 DIAGNOSIS — K228 Other specified diseases of esophagus: Secondary | ICD-10-CM | POA: Diagnosis not present

## 2018-01-21 DIAGNOSIS — R1312 Dysphagia, oropharyngeal phase: Secondary | ICD-10-CM | POA: Diagnosis present

## 2018-01-21 DIAGNOSIS — K449 Diaphragmatic hernia without obstruction or gangrene: Secondary | ICD-10-CM | POA: Insufficient documentation

## 2018-05-06 ENCOUNTER — Encounter: Payer: Self-pay | Admitting: Obstetrics and Gynecology

## 2018-07-03 DIAGNOSIS — Z9114 Patient's other noncompliance with medication regimen: Secondary | ICD-10-CM | POA: Insufficient documentation

## 2018-07-03 DIAGNOSIS — F3341 Major depressive disorder, recurrent, in partial remission: Secondary | ICD-10-CM | POA: Insufficient documentation

## 2018-07-08 ENCOUNTER — Emergency Department: Payer: Medicare Other

## 2018-07-08 ENCOUNTER — Emergency Department
Admission: EM | Admit: 2018-07-08 | Discharge: 2018-07-08 | Disposition: A | Discharge status: Home / Self Care | Payer: Medicare Other | Attending: Emergency Medicine | Admitting: Emergency Medicine

## 2018-07-08 ENCOUNTER — Other Ambulatory Visit: Payer: Self-pay

## 2018-07-08 DIAGNOSIS — R05 Cough: Secondary | ICD-10-CM | POA: Diagnosis present

## 2018-07-08 DIAGNOSIS — J189 Pneumonia, unspecified organism: Secondary | ICD-10-CM | POA: Diagnosis not present

## 2018-07-08 DIAGNOSIS — Z79899 Other long term (current) drug therapy: Secondary | ICD-10-CM | POA: Diagnosis not present

## 2018-07-08 DIAGNOSIS — J069 Acute upper respiratory infection, unspecified: Secondary | ICD-10-CM | POA: Diagnosis not present

## 2018-07-08 DIAGNOSIS — I129 Hypertensive chronic kidney disease with stage 1 through stage 4 chronic kidney disease, or unspecified chronic kidney disease: Secondary | ICD-10-CM | POA: Diagnosis not present

## 2018-07-08 DIAGNOSIS — E119 Type 2 diabetes mellitus without complications: Secondary | ICD-10-CM | POA: Diagnosis not present

## 2018-07-08 DIAGNOSIS — N189 Chronic kidney disease, unspecified: Secondary | ICD-10-CM | POA: Diagnosis not present

## 2018-07-08 MED ORDER — LEVOFLOXACIN 500 MG PO TABS: 500.0000 mg | ORAL_TABLET | Freq: Every day | ORAL | 0 refills | Status: AC

## 2018-07-08 MED ORDER — ALBUTEROL SULFATE (2.5 MG/3ML) 0.083% IN NEBU: 5.0000 mg | INHALATION_SOLUTION | Freq: Once | RESPIRATORY_TRACT | Status: DC

## 2018-07-08 MED ORDER — BENZONATATE 100 MG PO CAPS: ORAL_CAPSULE | ORAL | 0 refills | Status: DC

## 2018-07-08 NOTE — ED Provider Notes (Signed)
[facility] Emergency Department Provider Note  ____________________________________________   None    (approximate)  I have reviewed the triage vital signs and the nursing notes.   HISTORY  Chief Complaint Cough                                                                                                                                                                                                                                           HPI Sharon Dickson is a 84 y.o. female presents to the ED with complaint of productive cough for 4 days.  Patient states that she is unaware of any fever.  She states that the cough is worse at night.  She denies any nausea, vomiting or diarrhea.  There is been no chest pain or abdominal pain.  Patient denies any history of pneumonia but states that she has had a history of bronchitis in the past.   Past Medical History:  Diagnosis Date  . Allergic rhinitis   . Anemia   . Anxiety   . Benign essential tremor   . Bipolar disorder ([place])   . Cancer ([place])    skin ca  . Chronic kidney disease   . Cognitive impairment   . Cystic disease of breast   . Cystocele   . Depression   . Diabetes mellitus without complication ([place])   . Diverticulosis   . GERD (gastroesophageal reflux disease)   . Hyperlipidemia   . Hypertension   . Proteinuria   . SVT (supraventricular tachycardia) ([place])   . UTI (urinary tract infection)   . Vitamin B 12 deficiency   . Wears dentures    partial lower    Patient Active Problem List   Diagnosis Date Noted  . Dizziness 10/15/2015  . Melena   . GIB (gastrointestinal bleeding) 04/13/2015  . Anemia 04/13/2015    Past Surgical History:  Procedure Laterality Date  . ABDOMINAL HYSTERECTOMY    . APPENDECTOMY    . BLADDER SURGERY  10/2008  . CATARACT EXTRACTION W/PHACO Right 08/03/2015   Procedure: CATARACT EXTRACTION PHACO AND INTRAOCULAR LENS PLACEMENT (IOC);  Surgeon: Leandrew Koyanagi, MD;  Location: [place];  Service: Ophthalmology;  Laterality: Right;  DIABETIC - insulin  . CATARACT EXTRACTION W/PHACO Left 09/28/2015   Procedure: CATARACT EXTRACTION PHACO AND INTRAOCULAR LENS PLACEMENT (IOC);  Surgeon: Leandrew Koyanagi, MD;  Location:  [place];  Service: Ophthalmology;  Laterality: Left;  DIABETIC LEAVE PT 3RD  . CHOLECYSTECTOMY    . COLONOSCOPY  11/09/2014  . COLONOSCOPY WITH PROPOFOL N/A 04/05/2015   Procedure: COLONOSCOPY WITH PROPOFOL;  Surgeon: Lollie Sails, MD;  Location: [facility] ENDOSCOPY;  Service: Endoscopy;  Laterality: N/A;    Prior to Admission medications   Medication Sig Start Date End Date Taking? Authorizing Provider  amLODipine (NORVASC) 10 MG tablet Take 1 tablet (10 mg total) by mouth daily. 10/17/15   Hillary Bow, MD  aspirin EC 325 MG EC tablet Take 1 tablet (325 mg total) by mouth daily. 10/17/15   Hillary Bow, MD  benzonatate (TESSALON PERLES) 100 MG capsule 1-2 every 8 hours prn cough 07/08/18   Johnn Hai, PA-C  Biotin 1 MG CAPS Take 1 mg by mouth daily.     [provider]  cholecalciferol (VITAMIN D) 1000 UNITS tablet Take 1,000 Units by mouth daily.    [provider]  DUREZOL 0.05 % EMUL Place 1 drop into the left eye every morning. 09/21/15   [provider]  fluticasone (FLONASE) 50 MCG/ACT nasal spray Place 2 sprays into both nostrils daily as needed for rhinitis.     [provider]  insulin aspart protamine- aspart (NOVOLOG MIX 70/30) (70-30) 100 UNIT/ML injection Inject 14 Units into the skin daily with supper.    [provider]  insulin glargine (LANTUS) 100 UNIT/ML injection Inject 14 Units into the skin at bedtime.    [provider]  ketorolac (ACULAR) 0.4 % SOLN Place 1 drop into the left eye every morning. 09/21/15   [provider]  levofloxacin (LEVAQUIN) 500 MG tablet Take 1 tablet (500 mg total) by mouth daily for 7 days.  07/08/18 07/15/18  Johnn Hai, PA-C  losartan (COZAAR) 50 MG tablet Take 50 mg by mouth daily.    [provider]  meclizine (ANTIVERT) 25 MG tablet Take 1 tablet (25 mg total) by mouth 3 (three) times daily as needed for dizziness or nausea. 10/16/15   Hillary Bow, MD  metFORMIN (GLUCOPHAGE-XR) 500 MG 24 hr tablet Take 500 mg by mouth See admin instructions. Take 2 tablets (1000mg ) by mouth in the morning and take 1 tablet by mouth in the evening.    [provider]  multivitamin-iron-minerals-folic acid (CENTRUM) chewable tablet Chew 1 tablet by mouth daily.    [provider]  omeprazole (PRILOSEC) 20 MG capsule Take 20 mg by mouth at bedtime.     [provider]  ondansetron (ZOFRAN ODT) 4 MG disintegrating tablet Take 1 tablet (4 mg total) by mouth every 8 (eight) hours as needed for nausea or vomiting. 10/14/15   Earleen Newport, MD  primidone (MYSOLINE) 50 MG tablet Take 25 mg by mouth 2 (two) times daily.     [provider]  propranolol (INDERAL) 40 MG tablet Take 40 mg by mouth 2 (two) times daily.    [provider]  venlafaxine XR (EFFEXOR-XR) 75 MG 24 hr capsule Take 75-150 mg by mouth See admin instructions. Take 2 capsules (150mg ) by mouth in the morning and Take 1 capsule by mouth every night at bedtime.    [provider]  vitamin E 400 UNIT capsule Take 400 Units by mouth daily.    [provider]    Allergies Ceftin [cefuroxime axetil]; Codeine; Metformin; Paxil [paroxetine hcl]; and Prozac [fluoxetine hcl]  Family History  Problem Relation Age of Onset  .  Breast cancer Daughter 66    Social History Social History   Tobacco Use  . Smoking status: Never Smoker  . Smokeless tobacco: Never Used  Substance Use Topics  . Alcohol use: No  . Drug use: No    Review of Systems Constitutional: No fever/chills Eyes: No visual changes. ENT: No sore throat.  Positive for nasal  congestion. Cardiovascular: Denies chest pain. Respiratory: Denies shortness of breath.  Positive for productive cough. Gastrointestinal: No abdominal pain.  No nausea, no vomiting.  No diarrhea.   Genitourinary: Negative for dysuria. Musculoskeletal: Negative for back pain. Skin: Negative for rash. Neurological: Negative for headaches, focal weakness or numbness. ___________________________________________   PHYSICAL EXAM:  VITAL SIGNS: ED Triage Vitals  Enc Vitals Group     BP 07/08/18 0600 (!) 151/107     Pulse Rate 07/08/18 0600 62     Resp 07/08/18 0600 18     Temp 07/08/18 0600 98.3 F (36.8 C)     Temp Source 07/08/18 0600 Oral     SpO2 07/08/18 0600 97 %     Weight 07/08/18 0559 122 lb (55.3 kg)     Height 07/08/18 0559 5\' 4"  (1.626 m)     Head Circumference --      Peak Flow --      Pain Score 07/08/18 0559 0     Pain Loc --      Pain Edu? --      Excl. in [place]? --    Constitutional: Alert and oriented. Well appearing and in no acute distress. Eyes: Conjunctivae are normal.  Head: Atraumatic. Nose: No congestion/rhinnorhea. Mouth/Throat: Mucous membranes are moist.  Oropharynx non-erythematous. Neck: No stridor.   Hematological/Lymphatic/Immunilogical: No cervical lymphadenopathy. Cardiovascular: Normal rate, regular rhythm. Grossly normal heart sounds.  Good peripheral circulation. Respiratory: Normal respiratory effort.  No retractions. Lungs coarse congested cough is heard.  No wheezing is appreciated.  Patient is able to talk in complete sentences without any difficulty. Gastrointestinal: Soft and nontender. No distention.  Musculoskeletal: No lower extremity tenderness nor edema.  No joint effusions. Neurologic:  Normal speech and language. No gross focal neurologic deficits are appreciated. No gait instability. Skin:  Skin is warm, dry and intact. No rash noted. Psychiatric: Mood and affect are normal. Speech and behavior are  normal.  ____________________________________________   LABS (all labs ordered are listed, but only abnormal results are displayed)  Labs Reviewed - No data to display  RADIOLOGY  Official radiology report(s): Dg Chest 2 View  Result Date: 07/08/2018 CLINICAL DATA:  Productive cough 4 days EXAM: CHEST - 2 VIEW COMPARISON:  06/12/2010 FINDINGS: Mild bibasilar airspace disease is present, not seen on the prior study. Negative for heart failure or edema. No pleural effusion. Atherosclerotic aortic arch. No acute skeletal abnormality. IMPRESSION: Mild bibasilar atelectasis/infiltrate. Electronically Signed   By: Franchot Gallo M.D.   On: 07/08/2018 06:55    ____________________________________________   PROCEDURES  Procedure(s) performed: None  Procedures  Critical Care performed: No  ____________________________________________   INITIAL IMPRESSION / ASSESSMENT AND PLAN / ED COURSE  As part of my medical decision making, I reviewed the following data within the electronic MEDICAL RECORD NUMBER Notes from prior ED visits and [place] Controlled Substance Database  Patient presents to the ED with complaint of upper respiratory symptoms and cough which is worse at night.  She has experienced these symptoms for the last 4 days.  She has tried some over-the-counter medication without any relief.  Patient is unaware of  any fever or chills.  Physical exam is suggestive of an upper respiratory tract infection however chest x-ray per radiologist is questionable for atelectasis versus infiltrate bilaterally.  Patient states she does have a history of bronchitis but not pneumonia.  She was started on a prescription for Levaquin 500 mg daily along with Tessalon Perles as needed for cough.  She is to follow-up with her PCP or return to the emergency department if any severe worsening of her symptoms.  ____________________________________________   FINAL CLINICAL IMPRESSION(S) / ED DIAGNOSES  Final  diagnoses:  Upper respiratory tract infection, unspecified type  Community acquired pneumonia, unspecified laterality     ED Discharge Orders         Ordered    levofloxacin (LEVAQUIN) 500 MG tablet  Daily     07/08/18 0733    benzonatate (TESSALON PERLES) 100 MG capsule     07/08/18 4035           Note:  This document was prepared using Dragon voice recognition software and may include unintentional dictation errors.    Johnn Hai, PA-C 07/08/18 1430    Lavonia Drafts, MD 07/08/18 1430

## 2018-07-08 NOTE — Discharge Instructions (Addendum)
Follow up with your primary care provider if any continued problems  Tylenol for fever or body aches. Tessalon for cough.  1-2 every 8 hours for cough . Levaquin once a day for 7 days.

## 2018-07-08 NOTE — ED Notes (Signed)
See triage note  Presents with cough  States she developed cough several days  States her cough is hard.  No fever or other complaints

## 2018-07-08 NOTE — ED Triage Notes (Signed)
Pt arrives to ED via POV from home with c/o cough x4 days. Pt denies any c/o pain, no fever, no c/o N/V/D, no CP or abdominal pain. Pt states spouse with similar s/x's at home. Pt states cough has been productive with yellow/grren sputum, "especially at night".

## 2018-08-13 DIAGNOSIS — L111 Transient acantholytic dermatosis [Grover]: Secondary | ICD-10-CM | POA: Insufficient documentation

## 2018-08-15 DIAGNOSIS — K582 Mixed irritable bowel syndrome: Secondary | ICD-10-CM | POA: Insufficient documentation

## 2019-06-25 ENCOUNTER — Other Ambulatory Visit: Payer: Self-pay | Admitting: Internal Medicine

## 2019-06-25 ENCOUNTER — Ambulatory Visit
Admission: RE | Admit: 2019-06-25 | Discharge: 2019-06-25 | Disposition: A | Discharge status: Home / Self Care | Payer: Medicare Other | Source: Ambulatory Visit | Attending: Internal Medicine | Admitting: Internal Medicine

## 2019-06-25 ENCOUNTER — Other Ambulatory Visit (HOSPITAL_COMMUNITY): Payer: Self-pay | Admitting: Internal Medicine

## 2019-06-25 ENCOUNTER — Other Ambulatory Visit: Payer: Self-pay

## 2019-06-25 DIAGNOSIS — S0083XA Contusion of other part of head, initial encounter: Secondary | ICD-10-CM

## 2019-06-25 DIAGNOSIS — S0990XA Unspecified injury of head, initial encounter: Secondary | ICD-10-CM | POA: Insufficient documentation

## 2019-09-08 ENCOUNTER — Other Ambulatory Visit: Payer: Self-pay

## 2019-09-08 DIAGNOSIS — N390 Urinary tract infection, site not specified: Secondary | ICD-10-CM

## 2019-09-08 DIAGNOSIS — K219 Gastro-esophageal reflux disease without esophagitis: Secondary | ICD-10-CM | POA: Insufficient documentation

## 2019-09-08 DIAGNOSIS — F419 Anxiety disorder, unspecified: Secondary | ICD-10-CM | POA: Insufficient documentation

## 2019-09-08 DIAGNOSIS — R809 Proteinuria, unspecified: Secondary | ICD-10-CM | POA: Insufficient documentation

## 2019-09-08 DIAGNOSIS — E559 Vitamin D deficiency, unspecified: Secondary | ICD-10-CM | POA: Insufficient documentation

## 2019-09-08 DIAGNOSIS — J309 Allergic rhinitis, unspecified: Secondary | ICD-10-CM | POA: Insufficient documentation

## 2019-09-08 DIAGNOSIS — E1159 Type 2 diabetes mellitus with other circulatory complications: Secondary | ICD-10-CM | POA: Insufficient documentation

## 2019-09-08 DIAGNOSIS — E538 Deficiency of other specified B group vitamins: Secondary | ICD-10-CM | POA: Insufficient documentation

## 2019-09-08 DIAGNOSIS — G250 Essential tremor: Secondary | ICD-10-CM | POA: Insufficient documentation

## 2019-09-11 ENCOUNTER — Other Ambulatory Visit
Admission: RE | Admit: 2019-09-11 | Discharge: 2019-09-11 | Disposition: A | Discharge status: Home / Self Care | Payer: Medicare Other | Attending: Urology | Admitting: Urology

## 2019-09-11 ENCOUNTER — Encounter: Payer: Self-pay | Admitting: Urology

## 2019-09-11 ENCOUNTER — Ambulatory Visit (INDEPENDENT_AMBULATORY_CARE_PROVIDER_SITE_OTHER): Payer: Medicare Other | Admitting: Urology

## 2019-09-11 ENCOUNTER — Other Ambulatory Visit: Payer: Self-pay

## 2019-09-11 VITALS — BP 153/78 | HR 61

## 2019-09-11 DIAGNOSIS — N3000 Acute cystitis without hematuria: Secondary | ICD-10-CM | POA: Diagnosis not present

## 2019-09-11 DIAGNOSIS — M545 Low back pain, unspecified: Secondary | ICD-10-CM

## 2019-09-11 DIAGNOSIS — N390 Urinary tract infection, site not specified: Secondary | ICD-10-CM

## 2019-09-11 DIAGNOSIS — K5904 Chronic idiopathic constipation: Secondary | ICD-10-CM | POA: Diagnosis not present

## 2019-09-11 LAB — URINALYSIS, COMPLETE (UACMP) WITH MICROSCOPIC
Glucose, UA: NEGATIVE mg/dL
Nitrite: NEGATIVE
Protein, ur: >300 mg/dL — AB
Specific Gravity, Urine: >1.03 — ABNORMAL HIGH (ref 1.005–1.030)
WBC, UA: >50 WBC/hpf (ref 0–5)
pH: 5 (ref 5.0–8.0)

## 2019-09-11 LAB — BLADDER SCAN AMB NON-IMAGING

## 2019-09-11 MED ORDER — PREMARIN 0.625 MG/GM VA CREA: 1.0000 | TOPICAL_CREAM | Freq: Every day | VAGINAL | 12 refills | Status: DC

## 2019-09-11 NOTE — Patient Instructions (Addendum)
   1.  Please start using topical estrogen cream to the urethral opening 3 times a week at nighttime.  Please wash your hands extremely well before and after applying this.  2.  Start taking cranberry tablets at least twice a day, once in the morning and once in the afternoon for UTI prevention  3.  Please start a daily probiotic  4.  Please work on constipation.  I recommend a bowel cleanout and then starting a stool softener at least once possibly twice a day as needed.  5.  Recommend start a work-up to try to figure out why you are having so many infections.  I have ordered a renal ultrasound which she will be called to schedule.  Recommend a follow-up in a Mo weeks for pelvic exam and cystoscopy.

## 2019-09-11 NOTE — Progress Notes (Signed)
09/11/2019 2:29 PM   Sharon Dickson Dec 12, 1933 [id number]  Referring provider: Ezequiel Kayser, MD [facility] [place],  [place] [address]  Chief Complaint  Patient presents with  . Recurrent UTI    5 infections in the last year    HPI: 86 year old female with dementia who presents today accompanied by her husband for further evaluation of recurrent urinary tract infections.  Her husband provides most of the history today in the setting of dementia.  She has had numerous urinary tract infections over the past 12 months documented in care everywhere at [facility].  These have alternated between Klebsiella and E. coli.  She has been treated on numerous occasions.  Prior to this, UTIs were significantly less frequent.  Line has been resistant to Bactrim and fluoroquinolones as well as ampicillin.  No febrile infections or admissions.  Most recent cross-sectional imaging in 2016 with normal kidneys bilaterally.  No history of kidney stones.  She does have a personal history of cystocele status post "bladder surgery" in 2010.  No personal history of GYN malignancy.  She does have a personal history of chronic constipation.  She has not had a bowel movement in several days.  She is not on a specific bowel regimen for this.  No fecal incontinence.  She does wear a pull-up daily for urinary incontinence.  This is started over the past year or so.   In terms of symptoms, she reports that she has an internal burning sensation with infections.  Her incontinence is also worse.  With she and her husband also mentions today that she has been having severe sacroiliac pain in her low back.  She has seen numerous doctors for this including having cortisone injections.  Occasionally, it will radiate to her right lower quadrant.  PMH: Past Medical History:  Diagnosis Date  . Allergic rhinitis   . Anemia   . Anxiety   . Benign essential tremor   . Bipolar disorder ([place])    . Cancer ([place])    skin ca  . Chronic kidney disease   . Cognitive impairment   . Cystic disease of breast   . Cystocele   . Depression   . Diabetes mellitus without complication ([place])   . Diverticulosis   . GERD (gastroesophageal reflux disease)   . Hyperlipidemia   . Hypertension   . Proteinuria   . SVT (supraventricular tachycardia) ([place])   . UTI (urinary tract infection)   . Vitamin B 12 deficiency   . Wears dentures    partial lower    Surgical History: Past Surgical History:  Procedure Laterality Date  . ABDOMINAL HYSTERECTOMY    . APPENDECTOMY    . BLADDER SURGERY  10/2008  . CATARACT EXTRACTION W/PHACO Right 08/03/2015   Procedure: CATARACT EXTRACTION PHACO AND INTRAOCULAR LENS PLACEMENT (IOC);  Surgeon: Leandrew Koyanagi, MD;  Location: [place];  Service: Ophthalmology;  Laterality: Right;  DIABETIC - insulin  . CATARACT EXTRACTION W/PHACO Left 09/28/2015   Procedure: CATARACT EXTRACTION PHACO AND INTRAOCULAR LENS PLACEMENT (IOC);  Surgeon: Leandrew Koyanagi, MD;  Location: [facility];  Service: Ophthalmology;  Laterality: Left;  DIABETIC LEAVE PT 3RD  . CHOLECYSTECTOMY    . COLONOSCOPY  11/09/2014  . COLONOSCOPY WITH PROPOFOL N/A 04/05/2015   Procedure: COLONOSCOPY WITH PROPOFOL;  Surgeon: Lollie Sails, MD;  Location: [facility] ENDOSCOPY;  Service: Endoscopy;  Laterality: N/A;    Home Medications:  Allergies as of 09/11/2019  Reactions   Ceftin [cefuroxime Axetil] Nausea And Vomiting   Codeine Nausea And Vomiting   Metformin Diarrhea   Paxil [paroxetine Hcl] Other (See Comments)   Reaction:  Unknown    Prozac [fluoxetine Hcl] Other (See Comments)   Reaction:  Dizziness       Medication List       Accurate as of September 11, 2019  2:29 PM. If you have any questions, ask your nurse or doctor.        STOP taking these medications   benzonatate 100 MG capsule Commonly known as: Best boy Stopped by: Hollice Espy, MD    insulin aspart protamine- aspart (70-30) 100 UNIT/ML injection Commonly known as: NOVOLOG MIX 70/30 Stopped by: Hollice Espy, MD   ketorolac 0.4 % Soln Commonly known as: ACULAR Stopped by: Hollice Espy, MD   meclizine 25 MG tablet Commonly known as: ANTIVERT Stopped by: Hollice Espy, MD   ondansetron 4 MG disintegrating tablet Commonly known as: Zofran ODT Stopped by: Hollice Espy, MD   venlafaxine XR 75 MG 24 hr capsule Commonly known as: EFFEXOR-XR Stopped by: Hollice Espy, MD     TAKE these medications   acetaminophen 650 MG CR tablet Commonly known as: TYLENOL Take by mouth.   amLODipine 5 MG tablet Commonly known as: NORVASC Take 5 mg by mouth daily. What changed: Another medication with the same name was removed. Continue taking this medication, and follow the directions you see here. Changed by: Hollice Espy, MD   aspirin 325 MG EC tablet Take 1 tablet (325 mg total) by mouth daily.   Biotin 1 MG Caps Take 1 mg by mouth daily.   cholecalciferol 1000 units tablet Commonly known as: VITAMIN D Take 1,000 Units by mouth daily.   cyanocobalamin 1000 MCG/ML injection Commonly known as: (VITAMIN B-12) Inject into the muscle.   DULoxetine 60 MG capsule Commonly known as: CYMBALTA Take 60 mg by mouth daily.   Durezol 0.05 % Emul Generic drug: Difluprednate Place 1 drop into the left eye every morning.   fexofenadine 180 MG tablet Commonly known as: ALLEGRA Take 180 mg by mouth daily.   Fifty50 Pen Needles 32G X 4 MM Misc Generic drug: Insulin Pen Needle Use 1 each once daily   Fluocinolone Acetonide Body 0.01 % Oil APPLY 3 TIMES A DAY EXTERNALLY   fluticasone 50 MCG/ACT nasal spray Commonly known as: FLONASE Place 2 sprays into both nostrils daily as needed for rhinitis.   gabapentin 100 MG capsule Commonly known as: NEURONTIN Take 100 mg by mouth 3 (three) times daily.   glimepiride 2 MG tablet Commonly known as: AMARYL Take 2 mg  by mouth daily.   Lantus SoloStar 100 UNIT/ML Solostar Pen Generic drug: Insulin Glargine Inject into the skin. What changed: Another medication with the same name was removed. Continue taking this medication, and follow the directions you see here. Changed by: Hollice Espy, MD   loratadine 10 MG tablet Commonly known as: CLARITIN Take by mouth.   losartan 100 MG tablet Commonly known as: COZAAR Take 100 mg by mouth daily. What changed: Another medication with the same name was removed. Continue taking this medication, and follow the directions you see here. Changed by: Hollice Espy, MD   memantine 5 MG tablet Commonly known as: NAMENDA Take 5 mg by mouth 2 (two) times daily.   metFORMIN 500 MG 24 hr tablet Commonly known as: GLUCOPHAGE-XR Take 500 mg by mouth See admin instructions. Take 2 tablets (1000mg ) by mouth  in the morning and take 1 tablet by mouth in the evening.   multivitamin-iron-minerals-folic acid chewable tablet Chew 1 tablet by mouth daily.   CENTRUM SILVER 50+WOMEN PO Take by mouth.   omeprazole 20 MG capsule Commonly known as: PRILOSEC Take 20 mg by mouth at bedtime.   phenazopyridine 100 MG tablet Commonly known as: PYRIDIUM Take by mouth.   Precision QID Test test strip Generic drug: glucose blood Use once daily As directed   Premarin vaginal cream Generic drug: conjugated estrogens Place 1 Applicatorful vaginally daily. Use pea sized amount M-W-Fr before bedtime Started by: Hollice Espy, MD   primidone 50 MG tablet Commonly known as: MYSOLINE Take 25 mg by mouth 2 (two) times daily.   propranolol 40 MG tablet Commonly known as: INDERAL Take 40 mg by mouth 2 (two) times daily.   sertraline 50 MG tablet Commonly known as: ZOLOFT Take 50 mg by mouth daily.   vitamin E 180 MG (400 UNITS) capsule Take 400 Units by mouth daily.       Allergies:  Allergies  Allergen Reactions  . Ceftin [Cefuroxime Axetil] Nausea And Vomiting   . Codeine Nausea And Vomiting  . Metformin Diarrhea  . Paxil [Paroxetine Hcl] Other (See Comments)    Reaction:  Unknown   . Prozac [Fluoxetine Hcl] Other (See Comments)    Reaction:  Dizziness     Family History: Family History  Problem Relation Age of Onset  . Breast cancer Daughter 68    Social History:  reports that she has never smoked. She has never used smokeless tobacco. She reports that she does not drink alcohol or use drugs.   Physical Exam: BP (!) 153/78 (BP Location: Left Arm, Patient Position: Sitting, Cuff Size: Normal)   Pulse 61   Constitutional:  Alert and oriented, No acute distress.  Well-dressed.  Accompanied by husband.  Offers some history but primarily provided by her husband.  Confused with some questions. HEENT: [place] AT, moist mucus membranes.  Trachea midline, no masses. Skin: No rashes, bruises or suspicious lesions. Neurologic: Grossly intact, no focal deficits, moving all 4 extremities. Psychiatric: Normal mood and affect.  Laboratory Data: Lab Results  Component Value Date   WBC 8.5 10/16/2015   HGB 10.8 (L) 10/16/2015   HCT 32.4 (L) 10/16/2015   MCV 89.0 10/16/2015   PLT 346 10/16/2015    Lab Results  Component Value Date   CREATININE 0.85 10/16/2015    Lab Results  Component Value Date   HGBA1C 7.5 (H) 10/15/2015    Urinalysis    Component Value Date/Time   COLORURINE YELLOW 09/11/2019 1345   APPEARANCEUR CLOUDY (A) 09/11/2019 1345   LABSPEC >1.030 (H) 09/11/2019 1345   PHURINE 5.0 09/11/2019 1345   GLUCOSEU NEGATIVE 09/11/2019 1345   HGBUR MODERATE (A) 09/11/2019 1345   BILIRUBINUR SMALL (A) 09/11/2019 1345   KETONESUR TRACE (A) 09/11/2019 1345   PROTEINUR >300 (A) 09/11/2019 1345   NITRITE NEGATIVE 09/11/2019 1345   LEUKOCYTESUR MODERATE (A) 09/11/2019 1345    Lab Results  Component Value Date   BACTERIA MANY (A) 09/11/2019    Pertinent Imaging: Results for orders placed or performed in visit on 09/11/19  Bladder  Scan (Post Void Residual) in office  Result Value Ref Range   Scan Result 37mL     Assessment & Plan:    1. Recurrent UTI Likely multifactorial including constipation, incontinence, age-related, postmenopausal etc.  Adequate bladder emptying  Recommend eval with upper tract imaging and cystoscopy/ pelvic  exam rule out anatomic issues  In addition to the above, I recommended UTI prevention strategies including cranberry tablets twice daily, probiotics and initiation of topical estrogen cream.  No contraindication for this.  All recommendations were written out for the patient - US RENAL; Future - Bladder Scan (Post Void Residual) in office - Urine culture; Future  2. Chronic idiopathic constipation Recommend bowel cleanout with initiation of maintenance stool softener  Likely contributing factors #1  3. Acute cystitis without hematuria UA today has social for ongoing/recurrent infection  We will treat with culture specific antibiotics prior to cystoscopy  4. Low back pain, unspecified back pain laterality, unspecified chronicity, unspecified whether sciatica present Likely unrelated to above although will evaluate for urinary obstruction   Return in about 2 weeks (around 09/25/2019) for cysto/ pelvic.  Hollice Espy, MD  [facility] [address], [place], [place] [address] [phone]  I spent 60 total minutes on the day of the encounter including pre-visit review of the medical record, face-to-face time with the patient, and post visit ordering of labs/imaging/tests.

## 2019-09-13 LAB — URINE CULTURE: Culture: >=100000 — AB

## 2019-09-16 ENCOUNTER — Telehealth: Payer: Self-pay | Admitting: *Deleted

## 2019-09-16 NOTE — Telephone Encounter (Addendum)
Left Vm to return my call. Sent in RX.   ----- Message from Hollice Espy, MD sent at 09/14/2019  4:16 PM EST ----- Please treat with macrobid bid x 10 days  Hollice Espy, MD

## 2019-09-17 MED ORDER — NITROFURANTOIN MONOHYD MACRO 100 MG PO CAPS: 100.0000 mg | ORAL_CAPSULE | Freq: Two times a day (BID) | ORAL | 0 refills | Status: DC

## 2019-09-21 ENCOUNTER — Telehealth: Payer: Self-pay

## 2019-09-21 NOTE — Telephone Encounter (Signed)
Husband states that pt is feeling much better today. Advised husband on the need to be vigilant with pt's history of constipation. Advised they seek care in ED if no BM today. Husband voiced understanding.

## 2019-09-21 NOTE — Telephone Encounter (Signed)
Pt's husband called over the weekend and spoke with after hours nurse. Pt c/o lower abdominal pain. Pt was triaged and was advised to seek care in the ED. Pt did not go, called pt to follow up.

## 2019-09-22 ENCOUNTER — Other Ambulatory Visit: Payer: Self-pay

## 2019-09-22 ENCOUNTER — Ambulatory Visit
Admission: RE | Admit: 2019-09-22 | Discharge: 2019-09-22 | Disposition: A | Discharge status: Home / Self Care | Payer: Medicare Other | Source: Ambulatory Visit | Attending: Urology | Admitting: Urology

## 2019-09-22 DIAGNOSIS — N390 Urinary tract infection, site not specified: Secondary | ICD-10-CM

## 2019-09-23 ENCOUNTER — Telehealth: Payer: Self-pay

## 2019-09-23 NOTE — Telephone Encounter (Signed)
Patient's husband called today stating that Sharon Dickson has been having severe pain and pressure in her lower abdomen. Patient denies urinary symptoms but states pressure is terrible dull constant pain, some relief with laying down worse with movement. It was reviewed from patient's previous notes that she has a history of constipation and could this be the cause of her discomfort? Patient states last bowel movement was this morning but was very dark and loose. It was recommended to follow up with her GI doctor.

## 2019-09-25 ENCOUNTER — Ambulatory Visit (INDEPENDENT_AMBULATORY_CARE_PROVIDER_SITE_OTHER): Payer: Medicare Other | Admitting: Urology

## 2019-09-25 ENCOUNTER — Other Ambulatory Visit
Admission: RE | Admit: 2019-09-25 | Discharge: 2019-09-25 | Disposition: A | Discharge status: Home / Self Care | Payer: Medicare Other | Attending: Urology | Admitting: Urology

## 2019-09-25 ENCOUNTER — Other Ambulatory Visit: Payer: Self-pay

## 2019-09-25 DIAGNOSIS — N390 Urinary tract infection, site not specified: Secondary | ICD-10-CM | POA: Diagnosis present

## 2019-09-25 LAB — URINALYSIS, COMPLETE (UACMP) WITH MICROSCOPIC
Bilirubin Urine: NEGATIVE
Glucose, UA: NEGATIVE mg/dL
Ketones, ur: NEGATIVE mg/dL
Nitrite: NEGATIVE
Protein, ur: >300 mg/dL — AB
Specific Gravity, Urine: 1.025 (ref 1.005–1.030)
pH: 5 (ref 5.0–8.0)

## 2019-09-25 MED ORDER — CEPHALEXIN 500 MG PO CAPS: 500.0000 mg | ORAL_CAPSULE | Freq: Four times a day (QID) | ORAL | 0 refills | Status: DC

## 2019-09-25 NOTE — Progress Notes (Signed)
09/25/2019 4:24 PM   [name] 06-03-1934 [id number]  Referring provider: Ezequiel Kayser, MD [facility] [place],  [place] [address]  No chief complaint on file.   HPI: Sharon Dickson is a 86 yo F who presented today initially for cystoscopy but due to rUTI will need to reschedule for cysto.  She reports symptoms of burning when urinating, nausea, pain in the tailbone area, diarrhea and constant mild leakage. She denies vomiting.  She has been using estrogen cream.  Her recent RUS from 09/22/2019 shows a medical renal disease changes and cortical atrophy of both kidneys. Tiny right renal cyst 9 mm otherwise unremarkable.   No personal history of GYN malignancy.  +Urine culture 09/11/2019 indicative of E.coli resistant to Cipro, Ampicillin, Bactrim and Trimethoprim    PMH: Past Medical History:  Diagnosis Date  . Allergic rhinitis   . Anemia   . Anxiety   . Benign essential tremor   . Bipolar disorder ([place])   . Cancer ([place])    skin ca  . Chronic kidney disease   . Cognitive impairment   . Cystic disease of breast   . Cystocele   . Depression   . Diabetes mellitus without complication ([place])   . Diverticulosis   . GERD (gastroesophageal reflux disease)   . Hyperlipidemia   . Hypertension   . Proteinuria   . SVT (supraventricular tachycardia) ([place])   . UTI (urinary tract infection)   . Vitamin B 12 deficiency   . Wears dentures    partial lower    Surgical History: Past Surgical History:  Procedure Laterality Date  . ABDOMINAL HYSTERECTOMY    . APPENDECTOMY    . BLADDER SURGERY  10/2008  . CATARACT EXTRACTION W/PHACO Right 08/03/2015   Procedure: CATARACT EXTRACTION PHACO AND INTRAOCULAR LENS PLACEMENT (IOC);  Surgeon: Leandrew Koyanagi, MD;  Location: [facility];  Service: Ophthalmology;  Laterality: Right;  DIABETIC - insulin  . CATARACT EXTRACTION W/PHACO Left 09/28/2015   Procedure: CATARACT EXTRACTION PHACO  AND INTRAOCULAR LENS PLACEMENT (IOC);  Surgeon: Leandrew Koyanagi, MD;  Location: [facility];  Service: Ophthalmology;  Laterality: Left;  DIABETIC LEAVE PT 3RD  . CHOLECYSTECTOMY    . COLONOSCOPY  11/09/2014  . COLONOSCOPY WITH PROPOFOL N/A 04/05/2015   Procedure: COLONOSCOPY WITH PROPOFOL;  Surgeon: Lollie Sails, MD;  Location[facility] ENDOSCOPY;  Service: Endoscopy;  Laterality: N/A;    Home Medications:  Allergies as of 09/25/2019      Reactions   Ceftin [cefuroxime Axetil] Nausea And Vomiting   Codeine Nausea And Vomiting   Metformin Diarrhea   Paxil [paroxetine Hcl] Other (See Comments)   Reaction:  Unknown    Prozac [fluoxetine Hcl] Other (See Comments)   Reaction:  Dizziness       Medication List       Accurate as of September 25, 2019  4:24 PM. If you have any questions, ask your nurse or doctor.        acetaminophen 650 MG CR tablet Commonly known as: TYLENOL Take by mouth.   amLODipine 5 MG tablet Commonly known as: NORVASC Take 5 mg by mouth daily.   aspirin 325 MG EC tablet Take 1 tablet (325 mg total) by mouth daily.   Biotin 1 MG Caps Take 1 mg by mouth daily.   cholecalciferol 1000 units tablet Commonly known as: VITAMIN D Take 1,000 Units by mouth daily.   cyanocobalamin 1000 MCG/ML injection Commonly known as: (VITAMIN B-12)  Inject into the muscle.   DULoxetine 60 MG capsule Commonly known as: CYMBALTA Take 60 mg by mouth daily.   Durezol 0.05 % Emul Generic drug: Difluprednate Place 1 drop into the left eye every morning.   fexofenadine 180 MG tablet Commonly known as: ALLEGRA Take 180 mg by mouth daily.   Fifty50 Pen Needles 32G X 4 MM Misc Generic drug: Insulin Pen Needle Use 1 each once daily   Fluocinolone Acetonide Body 0.01 % Oil APPLY 3 TIMES A DAY EXTERNALLY   fluticasone 50 MCG/ACT nasal spray Commonly known as: FLONASE Place 2 sprays into both nostrils daily as needed for rhinitis.   gabapentin 100 MG  capsule Commonly known as: NEURONTIN Take 100 mg by mouth 3 (three) times daily.   glimepiride 2 MG tablet Commonly known as: AMARYL Take 2 mg by mouth daily.   Lantus SoloStar 100 UNIT/ML Solostar Pen Generic drug: insulin glargine Inject into the skin.   loratadine 10 MG tablet Commonly known as: CLARITIN Take by mouth.   losartan 100 MG tablet Commonly known as: COZAAR Take 100 mg by mouth daily.   memantine 5 MG tablet Commonly known as: NAMENDA Take 5 mg by mouth 2 (two) times daily.   metFORMIN 500 MG 24 hr tablet Commonly known as: GLUCOPHAGE-XR Take 500 mg by mouth See admin instructions. Take 2 tablets (1000mg ) by mouth in the morning and take 1 tablet by mouth in the evening.   multivitamin-iron-minerals-folic acid chewable tablet Chew 1 tablet by mouth daily.   CENTRUM SILVER 50+WOMEN PO Take by mouth.   nitrofurantoin (macrocrystal-monohydrate) 100 MG capsule Commonly known as: Macrobid Take 1 capsule (100 mg total) by mouth 2 (two) times daily.   omeprazole 20 MG capsule Commonly known as: PRILOSEC Take 20 mg by mouth at bedtime.   phenazopyridine 100 MG tablet Commonly known as: PYRIDIUM Take by mouth.   Precision QID Test test strip Generic drug: glucose blood Use once daily As directed   Premarin vaginal cream Generic drug: conjugated estrogens Place 1 Applicatorful vaginally daily. Use pea sized amount M-W-Fr before bedtime   primidone 50 MG tablet Commonly known as: MYSOLINE Take 25 mg by mouth 2 (two) times daily.   propranolol 40 MG tablet Commonly known as: INDERAL Take 40 mg by mouth 2 (two) times daily.   sertraline 50 MG tablet Commonly known as: ZOLOFT Take 50 mg by mouth daily.   vitamin E 180 MG (400 UNITS) capsule Take 400 Units by mouth daily.       Allergies:  Allergies  Allergen Reactions  . Ceftin [Cefuroxime Axetil] Nausea And Vomiting  . Codeine Nausea And Vomiting  . Metformin Diarrhea  . Paxil  [Paroxetine Hcl] Other (See Comments)    Reaction:  Unknown   . Prozac [Fluoxetine Hcl] Other (See Comments)    Reaction:  Dizziness     Family History: Family History  Problem Relation Age of Onset  . Breast cancer Daughter 59    Social History:  reports that she has never smoked. She has never used smokeless tobacco. She reports that she does not drink alcohol or use drugs.   Physical Exam: There were no vitals taken for this visit.  Constitutional:  Alert and oriented, No acute distress. HEENT: [place] AT, moist mucus membranes.  Trachea midline, no masses. Cardiovascular: No clubbing, cyanosis, or edema. Respiratory: Normal respiratory effort, no increased work of breathing. Skin: No rashes, bruises or suspicious lesions. Neurologic: Grossly intact, no focal deficits, moving all 4  extremities. Psychiatric: Normal mood and affect.  Laboratory Data:  Urinalysis    Component Value Date/Time   COLORURINE YELLOW 09/25/2019 1518   APPEARANCEUR CLOUDY (A) 09/25/2019 1518   LABSPEC 1.025 09/25/2019 1518   PHURINE 5.0 09/25/2019 1518   GLUCOSEU NEGATIVE 09/25/2019 1518   HGBUR SMALL (A) 09/25/2019 1518   BILIRUBINUR NEGATIVE 09/25/2019 1518   KETONESUR NEGATIVE 09/25/2019 1518   PROTEINUR >300 (A) 09/25/2019 1518   NITRITE NEGATIVE 09/25/2019 1518   LEUKOCYTESUR TRACE (A) 09/25/2019 1518    Lab Results  Component Value Date   BACTERIA MANY (A) 09/25/2019    Pertinent Imaging: Results for orders placed during the hospital encounter of 09/22/19  US RENAL   Narrative CLINICAL DATA:  Recurrent UTI  EXAM: RENAL / URINARY TRACT ULTRASOUND COMPLETE  COMPARISON:  CT abdomen and pelvis 04/13/2015  FINDINGS: Right Kidney:  Renal measurements: 8.8 x 4.1 x 4.1 cm = volume: 77 mL. Cortical thinning. Upper normal cortical echogenicity. Tiny exophytic cyst at mid kidney 9 x 8 x 9 mm. No additional mass or hydronephrosis.  Left Kidney:  Renal measurements: 10.2 x 4.8 x 4.7 cm  = volume: 119 mL. Cortical thinning. Increased cortical echogenicity. No mass, hydronephrosis, or shadowing calcification.  Bladder:  Appears normal for degree of bladder distention.  Other:  N/A  IMPRESSION: Medical renal disease changes and cortical atrophy of both kidneys.  Tiny RIGHT renal cyst 9 mm diameter.  Remainder of exam unremarkable.   Electronically Signed   By: Lavonia Dana M.D.   On: 09/22/2019 17:27     I have personally reviewed the images and agree with radiologist interpretation.   Assessment & Plan:    1. rUTI  UA today indicated infection with many bacteria; sent for urine culture  Pt still on estrogen cream   Given Keflex 500 mg 4x a day for a week and medical management of Keflex to 500 mg once daily after 1 week Reschedule cysto in 2 weeks from now with pt on antibiotic suppression   [facility] [address] [place], [place] [address] [phone]  I, Lucas Mallow, am acting as a scribe for Dr. Hollice Espy,  I have reviewed the above documentation for accuracy and completeness, and I agree with the above.   Hollice Espy, MD

## 2019-09-28 LAB — URINE CULTURE: Culture: >=100000 — AB

## 2019-10-06 ENCOUNTER — Other Ambulatory Visit: Payer: Self-pay

## 2019-10-06 DIAGNOSIS — N390 Urinary tract infection, site not specified: Secondary | ICD-10-CM

## 2019-10-08 NOTE — Progress Notes (Signed)
   10/09/19  CC:  Chief Complaint  Patient presents with  . Cysto    HPI: Sharon Dickson is a 86 y.o. white F who returns today for a 2 week f/u for cystoscopy.   Her most RUS from 09/22/2019 shows a medical renal disease changes and cortical atrophy of both kidneys. Tiny right renal cyst 9 mm otherwise unremarkable.   No personal history of GYN malignancy.  She has finished her full antibiotic course of Keflex last Saturday or Sunday.   She has been using more Estrogen cream than needed via applicator due to misunderstanding of instructions.   +Urine culture 09/11/2019 indicative of E.coli resistant to Cipro, Ampicillin, Bactrim and Trimethoprim    Vitals:   10/09/19 1455  BP: 110/70  Pulse: 80   NED. A&Ox3.   No respiratory distress   Abd soft, NT, ND Normal external genitalia with patent urethral meatus  Cystoscopy Procedure Note  Patient identification was confirmed, informed consent was obtained, and patient was prepped using Betadine solution.  Lidocaine jelly was administered per urethral meatus.    Procedure: - Flexible cystoscope introduced, without any difficulty.   - Thorough search of the bladder revealed:     consistent with known history of sacral carpal plexi and no mesh erosion noted - tenting of urinary bladder from right dome superiorly    normal urethral meatus    normal urothelium    no stones    no ulcers     no tumors    no urethral polyps    no trabeculation  - Ureteral orifices were normal in position and appearance.   Post-Procedure: - Patient tolerated the procedure well  Assessment/ Plan:  1. rUTIs Continue use of topical estrogen cream Re-educated pt and husband on only using pea sized amount of topical estrogen cream MWF  Antiboitics given today for history rUTIs for ppx F/u with UTI symptoms    I, Nethusan Sivanesan, am acting as a scribe for Dr. Hollice Espy,  I have reviewed the above documentation for accuracy and  completeness, and I agree with the above.   Hollice Espy, MD

## 2019-10-09 ENCOUNTER — Other Ambulatory Visit: Payer: Self-pay

## 2019-10-09 ENCOUNTER — Other Ambulatory Visit
Admission: RE | Admit: 2019-10-09 | Discharge: 2019-10-09 | Disposition: A | Discharge status: Home / Self Care | Payer: Medicare Other | Attending: Urology | Admitting: Urology

## 2019-10-09 ENCOUNTER — Ambulatory Visit (INDEPENDENT_AMBULATORY_CARE_PROVIDER_SITE_OTHER): Payer: Medicare Other | Admitting: Urology

## 2019-10-09 VITALS — BP 110/70 | HR 80 | Ht 60.0 in | Wt 115.0 lb

## 2019-10-09 DIAGNOSIS — N390 Urinary tract infection, site not specified: Secondary | ICD-10-CM | POA: Diagnosis present

## 2019-10-09 LAB — URINALYSIS, COMPLETE (UACMP) WITH MICROSCOPIC
Bilirubin Urine: NEGATIVE
Glucose, UA: NEGATIVE mg/dL
Hgb urine dipstick: NEGATIVE
Ketones, ur: NEGATIVE mg/dL
Nitrite: NEGATIVE
Protein, ur: >300 mg/dL — AB
RBC / HPF: NONE SEEN RBC/hpf (ref 0–5)
Specific Gravity, Urine: 1.025 (ref 1.005–1.030)
pH: 5 (ref 5.0–8.0)

## 2019-10-09 MED ORDER — CEFTRIAXONE SODIUM 1 G IJ SOLR
1.0000 g | Freq: Once | INTRAMUSCULAR | Status: AC
  Administered 2019-10-09: 1 g via INTRAMUSCULAR

## 2019-10-09 NOTE — Progress Notes (Signed)
IM Injection  Patient is present today for an IM Injection for treatment of UTI Drug: Rochephin Dose:1GM Location:Left upper outer buttocks Lot: [id number] Exp:06/21/2020 Patient tolerated well, no complications were noted  Preformed by: Fonnie Jarvis, CMA

## 2019-11-06 ENCOUNTER — Telehealth: Payer: Self-pay

## 2019-11-06 NOTE — Telephone Encounter (Signed)
Called patient's husband and answered questions in regards to premarin cream application. Patient's husband verbalized understanding

## 2020-03-04 ENCOUNTER — Ambulatory Visit
Admission: EM | Admit: 2020-03-04 | Discharge: 2020-03-04 | Disposition: A | Discharge status: Home / Self Care | Payer: Medicare Other | Attending: Internal Medicine | Admitting: Internal Medicine

## 2020-03-04 ENCOUNTER — Ambulatory Visit (INDEPENDENT_AMBULATORY_CARE_PROVIDER_SITE_OTHER): Payer: Medicare Other

## 2020-03-04 DIAGNOSIS — S52502A Unspecified fracture of the lower end of left radius, initial encounter for closed fracture: Secondary | ICD-10-CM

## 2020-03-04 HISTORY — DX: Dementia in other diseases classified elsewhere, unspecified severity, without behavioral disturbance, psychotic disturbance, mood disturbance, and anxiety: F02.80

## 2020-03-04 NOTE — Discharge Instructions (Signed)
If you notice any worsening pain, finger swelling or duskiness please return to the urgent care to be reevaluated. Take Tylenol every 6 hours as needed for pain .

## 2020-03-04 NOTE — ED Triage Notes (Signed)
Patient in today after 2 falls this a.m. w/ left wrist pain. Patient has unsteady gait and disorientation. Husband states he has noticed changes in her over the last 24 hrs. He states yesterday she slept 13-14 hrs, played cards, watched a movie, and went to bed. This morning around 5a, the patient fell on her way to the bathroom and a second fall occurred around 7a when the patient fell coming back from the kitchen and her husband had to help her up.

## 2020-03-08 NOTE — ED Provider Notes (Signed)
[facility]    CSN: [id number] Arrival date & time: 03/04/20  1233      History   Chief Complaint Chief Complaint  Patient presents with  . Wrist Pain    Left    HPI Sharon Dickson is a 86 y.o. female comes to the urgent care with left wrist pain which started early hours of today.Patient has an unsteady gait and fell this AM.  Pain is moderate in severity, aggravated by movement and palpation, no known relieving factors and associated with some left wrist deformity.  No headaches, dizziness, nausea, vomiting or any scalp bruising or swelling. HPI  Past Medical History:  Diagnosis Date  . Allergic rhinitis   . Anemia   . Anxiety   . Benign essential tremor   . Bipolar disorder ([place])   . Cancer ([place])    skin ca  . Chronic kidney disease   . Cognitive impairment   . Cystic disease of breast   . Cystocele   . Depression   . Diabetes mellitus without complication ([place])   . Diverticulosis   . GERD (gastroesophageal reflux disease)   . Hyperlipidemia   . Hypertension   . Lewy body dementia ([place])    Dx 3 yrs ago, per husband.   . Proteinuria   . SVT (supraventricular tachycardia) ([place])   . UTI (urinary tract infection)   . Vitamin B 12 deficiency   . Wears dentures    partial lower    Patient Active Problem List   Diagnosis Date Noted  . Allergic rhinitis 09/08/2019  . Anxiety 09/08/2019  . B12 deficiency 09/08/2019  . Benign essential tremor 09/08/2019  . Vitamin D deficiency 09/08/2019  . Proteinuria 09/08/2019  . Hypertension associated with type 2 diabetes mellitus ([place]) 09/08/2019  . GERD (gastroesophageal reflux disease) 09/08/2019  . Irritable bowel syndrome with both constipation and diarrhea 08/15/2018  . 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 Recurrent major depressive disorder, in partial remission ([place]) 07/03/2018  . Noncompliance with medications 07/03/2018  . Degenerative disc disease, cervical 08/19/2017  . Chronic right-sided low  back pain with right-sided sciatica 12/27/2016  . Dysphagia, pharyngeal phase 07/10/2016  . Recurrent falls 10/24/2015  . Gait instability 10/24/2015  . Dizziness 10/15/2015  . Melena   . GIB (gastrointestinal bleeding) 04/13/2015  . Anemia 04/13/2015  . Syncope and collapse 02/16/2015  . History of adenomatous polyp of colon 11/09/2014  . Chest pain 07/07/2014  . Long-term insulin use ([place]) 03/25/2014  . Lewy body dementia ([place]) 01/07/2014  . Chronic kidney disease, stage III (moderate) 12/08/2013  . Type 2 or unspecified type diabetes mellitus with renal manifestations 12/08/2013  . Hyperlipidemia associated with type 2 diabetes mellitus ([place]) 12/08/2013    Past Surgical History:  Procedure Laterality Date  . ABDOMINAL HYSTERECTOMY    . APPENDECTOMY    . BLADDER SURGERY  10/2008  . CATARACT EXTRACTION W/PHACO Right 08/03/2015   Procedure: CATARACT EXTRACTION PHACO AND INTRAOCULAR LENS PLACEMENT (IOC);  Surgeon: Leandrew Koyanagi, MD;  Location: [facility];  Service: Ophthalmology;  Laterality: Right;  DIABETIC - insulin  . CATARACT EXTRACTION W/PHACO Left 09/28/2015   Procedure: CATARACT EXTRACTION PHACO AND INTRAOCULAR LENS PLACEMENT (IOC);  Surgeon: Leandrew Koyanagi, MD;  Location: [facility];  Service: Ophthalmology;  Laterality: Left;  DIABETIC LEAVE PT 3RD  . CHOLECYSTECTOMY    . COLONOSCOPY  11/09/2014  . COLONOSCOPY WITH PROPOFOL N/A 04/05/2015   Procedure: COLONOSCOPY WITH PROPOFOL;  Surgeon: Lollie Sails, MD;  Location: [facility];  Service: Endoscopy;  Laterality: N/A;    OB History   No obstetric history on file.      Home Medications    Prior to Admission medications   Medication Sig Start Date End Date Taking? Authorizing Provider  acetaminophen (TYLENOL) 650 MG CR tablet Take by mouth.   Yes [provider]  amLODipine (NORVASC) 5 MG tablet Take 5 mg by mouth daily. 07/08/19  Yes [provider]  aspirin EC 325  MG EC tablet Take 1 tablet (325 mg total) by mouth daily. 10/17/15  Yes Sudini, Alveta Heimlich, MD  cholecalciferol (VITAMIN D) 1000 UNITS tablet Take 1,000 Units by mouth daily.   Yes [provider]  fexofenadine (ALLEGRA) 180 MG tablet Take 180 mg by mouth daily. 08/14/19  Yes [provider]  fluticasone (FLONASE) 50 MCG/ACT nasal spray Place 2 sprays into both nostrils daily as needed for rhinitis.    Yes [provider]  JARDIANCE 10 MG TABS tablet Take 10 mg by mouth daily. 02/18/20  Yes [provider]  losartan (COZAAR) 100 MG tablet Take 100 mg by mouth daily. 07/08/19  Yes [provider]  multivitamin-iron-minerals-folic acid (CENTRUM) chewable tablet Chew 1 tablet by mouth daily.   Yes [provider]  omeprazole (PRILOSEC) 20 MG capsule Take 20 mg by mouth at bedtime.    Yes [provider]  propranolol (INDERAL) 40 MG tablet Take 40 mg by mouth 2 (two) times daily.   Yes [provider]  vitamin E 400 UNIT capsule Take 400 Units by mouth daily.   Yes [provider]  glucose blood (PRECISION QID TEST) test strip Use once daily As directed 08/31/19   [provider]  Insulin Glargine (LANTUS SOLOSTAR) 100 UNIT/ML Solostar Pen Inject into the skin. 07/08/19   [provider]  Insulin Pen Needle (FIFTY50 PEN NEEDLES) 32G X 4 MM MISC Use 1 each once daily 07/28/18   [provider]  Multiple Vitamins-Minerals (CENTRUM SILVER 50+WOMEN PO) Take by mouth.    [provider]  gabapentin (NEURONTIN) 100 MG capsule Take 100 mg by mouth 3 (three) times daily. 04/25/19 03/04/20  [provider]  glimepiride (AMARYL) 2 MG tablet Take 2 mg by mouth daily. 07/08/19 03/04/20  [provider]  loratadine (CLARITIN) 10 MG tablet Take by mouth.  03/04/20  [provider]    Family History Family History  Problem Relation Age of Onset  . Breast cancer Daughter 96    Social  History Social History   Tobacco Use  . Smoking status: Never Smoker  . Smokeless tobacco: Never Used  Substance Use Topics  . Alcohol use: No  . Drug use: No     Allergies   Ceftin [cefuroxime axetil], Codeine, Metformin, Paxil [paroxetine hcl], and Prozac [fluoxetine hcl]   Review of Systems Review of Systems  Respiratory: Negative.   Gastrointestinal: Negative.   Musculoskeletal: Positive for arthralgias and joint swelling. Negative for back pain, gait problem, myalgias, neck pain and neck stiffness.  Neurological: Negative for dizziness, light-headedness and headaches.     Physical Exam Triage Vital Signs ED Triage Vitals  Enc Vitals Group     BP 03/04/20 1311 118/88     Pulse Rate 03/04/20 1311 61     Resp 03/04/20 1311 16     Temp 03/04/20 1311 (!) 97.5 F (36.4 C)     Temp Source 03/04/20 1311 Oral     SpO2 03/04/20 1311 98 %  Weight 03/04/20 1302 109 lb (49.4 kg)     Height 03/04/20 1302 5\' 4"  (1.626 m)     Head Circumference --      Peak Flow --      Pain Score 03/04/20 1308 5     Pain Loc --      Pain Edu? --      Excl. in [place]? --    No data found.  Updated Vital Signs BP 118/88 (BP Location: Right Arm)   Pulse 61   Temp (!) 97.5 F (36.4 C) (Oral)   Resp 16   Ht 5\' 4"  (1.626 m)   Wt 49.4 kg   SpO2 98%   BMI 18.71 kg/m   Visual Acuity Right Eye Distance:   Left Eye Distance:   Bilateral Distance:    Right Eye Near:   Left Eye Near:    Bilateral Near:     Physical Exam Vitals and nursing note reviewed.  Cardiovascular:     Rate and Rhythm: Normal rate and regular rhythm.  Musculoskeletal:     Comments: Left wrist and distal forearm tenderness.  Swelling and dorsal angulation of the distal forearm.  No bruising.  Skin is intact.  Neurological:     Mental Status: She is alert.      UC Treatments / Results  Labs (all labs ordered are listed, but only abnormal results are displayed) Labs Reviewed - No data to  display  EKG   Radiology No results found.  Procedures Procedures (including critical care time)  Medications Ordered in UC Medications - No data to display  Initial Impression / Assessment and Plan / UC Course  I have reviewed the triage vital signs and the nursing notes.  Pertinent labs & imaging results that were available during my care of the patient were reviewed by me and considered in my medical decision making (see chart for details).    1.  Closed fracture of the distal end of the radius: Tylenol as needed for pain I called the orthopedic surgeon on call to discuss plan of care.  Short arm splint was applied Patient will follow up with the orthopedic surgeon in 1 week Return precautions given. Final Clinical Impressions(s) / UC Diagnoses   Final diagnoses:  Closed fracture of distal end of left radius, unspecified fracture morphology, initial encounter     Discharge Instructions     If you notice any worsening pain, finger swelling or duskiness please return to the urgent care to be reevaluated. Take Tylenol every 6 hours as needed for pain .   ED Prescriptions    None     PDMP not reviewed this encounter.   Chase Picket, MD 03/08/20 [phone]

## 2020-03-21 ENCOUNTER — Emergency Department: Payer: Medicare Other

## 2020-03-21 ENCOUNTER — Other Ambulatory Visit: Payer: Self-pay

## 2020-03-21 ENCOUNTER — Encounter: Payer: Self-pay | Admitting: *Deleted

## 2020-03-21 DIAGNOSIS — Z20822 Contact with and (suspected) exposure to covid-19: Secondary | ICD-10-CM | POA: Diagnosis present

## 2020-03-21 DIAGNOSIS — F419 Anxiety disorder, unspecified: Secondary | ICD-10-CM | POA: Diagnosis present

## 2020-03-21 DIAGNOSIS — E1165 Type 2 diabetes mellitus with hyperglycemia: Secondary | ICD-10-CM | POA: Diagnosis present

## 2020-03-21 DIAGNOSIS — E1169 Type 2 diabetes mellitus with other specified complication: Secondary | ICD-10-CM | POA: Diagnosis present

## 2020-03-21 DIAGNOSIS — F319 Bipolar disorder, unspecified: Secondary | ICD-10-CM | POA: Diagnosis present

## 2020-03-21 DIAGNOSIS — Z9114 Patient's other noncompliance with medication regimen: Secondary | ICD-10-CM

## 2020-03-21 DIAGNOSIS — N390 Urinary tract infection, site not specified: Principal | ICD-10-CM | POA: Diagnosis present

## 2020-03-21 DIAGNOSIS — E875 Hyperkalemia: Secondary | ICD-10-CM | POA: Diagnosis present

## 2020-03-21 DIAGNOSIS — E871 Hypo-osmolality and hyponatremia: Secondary | ICD-10-CM | POA: Diagnosis present

## 2020-03-21 DIAGNOSIS — F0280 Dementia in other diseases classified elsewhere without behavioral disturbance: Secondary | ICD-10-CM | POA: Diagnosis present

## 2020-03-21 DIAGNOSIS — Z79899 Other long term (current) drug therapy: Secondary | ICD-10-CM

## 2020-03-21 DIAGNOSIS — R2689 Other abnormalities of gait and mobility: Secondary | ICD-10-CM | POA: Diagnosis present

## 2020-03-21 DIAGNOSIS — G9341 Metabolic encephalopathy: Secondary | ICD-10-CM | POA: Diagnosis present

## 2020-03-21 DIAGNOSIS — E860 Dehydration: Secondary | ICD-10-CM | POA: Diagnosis present

## 2020-03-21 DIAGNOSIS — G3183 Dementia with Lewy bodies: Secondary | ICD-10-CM | POA: Diagnosis present

## 2020-03-21 DIAGNOSIS — R7989 Other specified abnormal findings of blood chemistry: Secondary | ICD-10-CM | POA: Diagnosis present

## 2020-03-21 DIAGNOSIS — N183 Chronic kidney disease, stage 3 unspecified: Secondary | ICD-10-CM | POA: Diagnosis present

## 2020-03-21 DIAGNOSIS — Z87440 Personal history of urinary (tract) infections: Secondary | ICD-10-CM

## 2020-03-21 DIAGNOSIS — E1122 Type 2 diabetes mellitus with diabetic chronic kidney disease: Secondary | ICD-10-CM | POA: Diagnosis present

## 2020-03-21 DIAGNOSIS — K219 Gastro-esophageal reflux disease without esophagitis: Secondary | ICD-10-CM | POA: Diagnosis present

## 2020-03-21 DIAGNOSIS — I129 Hypertensive chronic kidney disease with stage 1 through stage 4 chronic kidney disease, or unspecified chronic kidney disease: Secondary | ICD-10-CM | POA: Diagnosis present

## 2020-03-21 DIAGNOSIS — Z7982 Long term (current) use of aspirin: Secondary | ICD-10-CM

## 2020-03-21 DIAGNOSIS — Z9049 Acquired absence of other specified parts of digestive tract: Secondary | ICD-10-CM

## 2020-03-21 DIAGNOSIS — Z794 Long term (current) use of insulin: Secondary | ICD-10-CM

## 2020-03-21 DIAGNOSIS — N179 Acute kidney failure, unspecified: Secondary | ICD-10-CM | POA: Diagnosis present

## 2020-03-21 DIAGNOSIS — E785 Hyperlipidemia, unspecified: Secondary | ICD-10-CM | POA: Diagnosis present

## 2020-03-21 DIAGNOSIS — K449 Diaphragmatic hernia without obstruction or gangrene: Secondary | ICD-10-CM | POA: Diagnosis present

## 2020-03-21 LAB — COMPREHENSIVE METABOLIC PANEL
ALT: 14 U/L (ref 0–44)
AST: 19 U/L (ref 15–41)
Albumin: 3.8 g/dL (ref 3.5–5.0)
Alkaline Phosphatase: 73 U/L (ref 38–126)
Anion gap: 15 (ref 5–15)
BUN: 42 mg/dL — ABNORMAL HIGH (ref 8–23)
CO2: 18 mmol/L — ABNORMAL LOW (ref 22–32)
Calcium: 9.5 mg/dL (ref 8.9–10.3)
Chloride: 100 mmol/L (ref 98–111)
Creatinine, Ser: 1.63 mg/dL — ABNORMAL HIGH (ref 0.44–1.00)
GFR calc Af Amer: 33 mL/min — ABNORMAL LOW (ref >60)
GFR calc non Af Amer: 29 mL/min — ABNORMAL LOW (ref >60)
Glucose, Bld: 438 mg/dL — ABNORMAL HIGH (ref 70–99)
Potassium: 5.2 mmol/L — ABNORMAL HIGH (ref 3.5–5.1)
Sodium: 133 mmol/L — ABNORMAL LOW (ref 135–145)
Total Bilirubin: 1.8 mg/dL — ABNORMAL HIGH (ref 0.3–1.2)
Total Protein: 7.3 g/dL (ref 6.5–8.1)

## 2020-03-21 LAB — CBC
HCT: 39.1 % (ref 36.0–46.0)
Hemoglobin: 13.5 g/dL (ref 12.0–15.0)
MCH: 33.2 pg (ref 26.0–34.0)
MCHC: 34.5 g/dL (ref 30.0–36.0)
MCV: 96.1 fL (ref 80.0–100.0)
Platelets: 377 10*3/uL (ref 150–400)
RBC: 4.07 MIL/uL (ref 3.87–5.11)
RDW: 12.4 % (ref 11.5–15.5)
WBC: 10.2 10*3/uL (ref 4.0–10.5)
nRBC: 0 % (ref 0.0–0.2)

## 2020-03-21 LAB — URINALYSIS, COMPLETE (UACMP) WITH MICROSCOPIC
Bilirubin Urine: NEGATIVE
Glucose, UA: >=500 mg/dL — AB
Hgb urine dipstick: NEGATIVE
Ketones, ur: 5 mg/dL — AB
Nitrite: NEGATIVE
Protein, ur: 100 mg/dL — AB
Specific Gravity, Urine: 1.023 (ref 1.005–1.030)
pH: 5 (ref 5.0–8.0)

## 2020-03-21 LAB — TSH: TSH: 1.12 u[IU]/mL (ref 0.350–4.500)

## 2020-03-21 LAB — TROPONIN I (HIGH SENSITIVITY): Troponin I (High Sensitivity): 63 ng/L — ABNORMAL HIGH (ref <18)

## 2020-03-21 NOTE — ED Triage Notes (Signed)
Pt brought in via ems from home with AMS.  EMS report recent UTI  Pt has a long arm cast on left arm.  Pt is confused and hard of hearing.  Pt has iv in [place].  Pt alert.

## 2020-03-21 NOTE — ED Triage Notes (Signed)
Pt brought in by EMS from home with reports of increased confusion per husband; currently being tx for UTI

## 2020-03-22 ENCOUNTER — Emergency Department: Payer: Medicare Other

## 2020-03-22 ENCOUNTER — Inpatient Hospital Stay
Admission: EM | Admit: 2020-03-22 | Discharge: 2020-03-24 | DRG: 689 | Disposition: A | Discharge status: Home / Self Care | Payer: Medicare Other | Attending: Family Medicine | Admitting: Family Medicine

## 2020-03-22 DIAGNOSIS — R531 Weakness: Secondary | ICD-10-CM | POA: Diagnosis not present

## 2020-03-22 DIAGNOSIS — E785 Hyperlipidemia, unspecified: Secondary | ICD-10-CM | POA: Diagnosis present

## 2020-03-22 DIAGNOSIS — E1159 Type 2 diabetes mellitus with other circulatory complications: Secondary | ICD-10-CM | POA: Diagnosis present

## 2020-03-22 DIAGNOSIS — E860 Dehydration: Secondary | ICD-10-CM

## 2020-03-22 DIAGNOSIS — F419 Anxiety disorder, unspecified: Secondary | ICD-10-CM | POA: Diagnosis present

## 2020-03-22 DIAGNOSIS — E1169 Type 2 diabetes mellitus with other specified complication: Secondary | ICD-10-CM | POA: Diagnosis present

## 2020-03-22 DIAGNOSIS — N390 Urinary tract infection, site not specified: Secondary | ICD-10-CM

## 2020-03-22 DIAGNOSIS — G3183 Dementia with Lewy bodies: Secondary | ICD-10-CM | POA: Diagnosis present

## 2020-03-22 DIAGNOSIS — N179 Acute kidney failure, unspecified: Secondary | ICD-10-CM

## 2020-03-22 DIAGNOSIS — G9341 Metabolic encephalopathy: Secondary | ICD-10-CM | POA: Diagnosis present

## 2020-03-22 DIAGNOSIS — N183 Chronic kidney disease, stage 3 unspecified: Secondary | ICD-10-CM | POA: Diagnosis present

## 2020-03-22 DIAGNOSIS — R2681 Unsteadiness on feet: Secondary | ICD-10-CM | POA: Diagnosis present

## 2020-03-22 DIAGNOSIS — R778 Other specified abnormalities of plasma proteins: Secondary | ICD-10-CM

## 2020-03-22 DIAGNOSIS — F0280 Dementia in other diseases classified elsewhere without behavioral disturbance: Secondary | ICD-10-CM | POA: Diagnosis present

## 2020-03-22 DIAGNOSIS — I152 Hypertension secondary to endocrine disorders: Secondary | ICD-10-CM | POA: Diagnosis present

## 2020-03-22 DIAGNOSIS — R4182 Altered mental status, unspecified: Secondary | ICD-10-CM

## 2020-03-22 LAB — CREATININE, SERUM
Creatinine, Ser: 1.08 mg/dL — ABNORMAL HIGH (ref 0.44–1.00)
GFR calc Af Amer: 55 mL/min — ABNORMAL LOW (ref >60)
GFR calc non Af Amer: 47 mL/min — ABNORMAL LOW (ref >60)

## 2020-03-22 LAB — GLUCOSE, CAPILLARY
Glucose-Capillary: 365 mg/dL — ABNORMAL HIGH (ref 70–99)
Glucose-Capillary: 397 mg/dL — ABNORMAL HIGH (ref 70–99)
Glucose-Capillary: 300 mg/dL — ABNORMAL HIGH (ref 70–99)

## 2020-03-22 LAB — URINALYSIS, COMPLETE (UACMP) WITH MICROSCOPIC
Bilirubin Urine: NEGATIVE
Glucose, UA: >=500 mg/dL — AB
Hgb urine dipstick: NEGATIVE
Ketones, ur: 5 mg/dL — AB
Leukocytes,Ua: NEGATIVE
Nitrite: NEGATIVE
Protein, ur: 100 mg/dL — AB
Specific Gravity, Urine: 1.022 (ref 1.005–1.030)
Squamous Epithelial / HPF: NONE SEEN (ref 0–5)
pH: 5 (ref 5.0–8.0)

## 2020-03-22 LAB — CBC
HCT: 36.4 % (ref 36.0–46.0)
Hemoglobin: 12.2 g/dL (ref 12.0–15.0)
MCH: 32.3 pg (ref 26.0–34.0)
MCHC: 33.5 g/dL (ref 30.0–36.0)
MCV: 96.3 fL (ref 80.0–100.0)
Platelets: 254 10*3/uL (ref 150–400)
RBC: 3.78 MIL/uL — ABNORMAL LOW (ref 3.87–5.11)
RDW: 12.6 % (ref 11.5–15.5)
WBC: 9.3 10*3/uL (ref 4.0–10.5)
nRBC: 0 % (ref 0.0–0.2)

## 2020-03-22 LAB — AMMONIA: Ammonia: 22 umol/L (ref 9–35)

## 2020-03-22 LAB — PROCALCITONIN: Procalcitonin: <0.1 ng/mL

## 2020-03-22 LAB — TROPONIN I (HIGH SENSITIVITY): Troponin I (High Sensitivity): 85 ng/L — ABNORMAL HIGH (ref <18)

## 2020-03-22 LAB — LACTIC ACID, PLASMA: Lactic Acid, Venous: 1.8 mmol/L (ref 0.5–1.9)

## 2020-03-22 LAB — SARS CORONAVIRUS 2 BY RT PCR (HOSPITAL ORDER, PERFORMED IN ~~LOC~~ HOSPITAL LAB): SARS Coronavirus 2: NEGATIVE

## 2020-03-22 LAB — TSH: TSH: 0.552 u[IU]/mL (ref 0.350–4.500)

## 2020-03-22 MED ORDER — ENOXAPARIN SODIUM 40 MG/0.4ML ~~LOC~~ SOLN
40.0000 mg | SUBCUTANEOUS | Status: DC
  Administered 2020-03-22: 40 mg via SUBCUTANEOUS
  Filled 2020-03-22: qty 0.4

## 2020-03-22 MED ORDER — SODIUM CHLORIDE 0.9 % IV SOLN
1.0000 g | INTRAVENOUS | Status: AC
  Administered 2020-03-22: 1 g via INTRAVENOUS
  Filled 2020-03-22: qty 10

## 2020-03-22 MED ORDER — PROPRANOLOL HCL 40 MG PO TABS
40.0000 mg | ORAL_TABLET | Freq: Two times a day (BID) | ORAL | Status: DC
  Administered 2020-03-22 – 2020-03-24 (×4): 40 mg via ORAL
  Filled 2020-03-22 (×3): qty 1
  Filled 2020-03-22: qty 2
  Filled 2020-03-22: qty 1

## 2020-03-22 MED ORDER — SODIUM CHLORIDE 0.9 % IV SOLN: INTRAVENOUS | Status: AC

## 2020-03-22 MED ORDER — ASPIRIN EC 81 MG PO TBEC
81.0000 mg | DELAYED_RELEASE_TABLET | Freq: Every day | ORAL | Status: DC
  Administered 2020-03-22 – 2020-03-23 (×2): 81 mg via ORAL
  Filled 2020-03-22 (×2): qty 1

## 2020-03-22 MED ORDER — ACETAMINOPHEN 650 MG RE SUPP: 650.0000 mg | Freq: Four times a day (QID) | RECTAL | Status: DC | PRN

## 2020-03-22 MED ORDER — AMLODIPINE BESYLATE 5 MG PO TABS
5.0000 mg | ORAL_TABLET | Freq: Every day | ORAL | Status: DC
  Administered 2020-03-22 – 2020-03-24 (×3): 5 mg via ORAL
  Filled 2020-03-22 (×3): qty 1

## 2020-03-22 MED ORDER — SODIUM CHLORIDE 0.9 % IV BOLUS: 1000.0000 mL | Freq: Once | INTRAVENOUS | Status: DC

## 2020-03-22 MED ORDER — ONDANSETRON HCL 4 MG/2ML IJ SOLN
4.0000 mg | INTRAMUSCULAR | Status: AC
  Filled 2020-03-22: qty 2

## 2020-03-22 MED ORDER — POLYETHYLENE GLYCOL 3350 17 G PO PACK: 17.0000 g | PACK | Freq: Every day | ORAL | Status: DC | PRN

## 2020-03-22 MED ORDER — INSULIN ASPART 100 UNIT/ML ~~LOC~~ SOLN
0.0000 [IU] | Freq: Three times a day (TID) | SUBCUTANEOUS | Status: DC
  Administered 2020-03-22: 5 [IU] via SUBCUTANEOUS
  Administered 2020-03-22: 9 [IU] via SUBCUTANEOUS
  Filled 2020-03-22 (×2): qty 1

## 2020-03-22 MED ORDER — PANTOPRAZOLE SODIUM 40 MG PO TBEC
40.0000 mg | DELAYED_RELEASE_TABLET | Freq: Every day | ORAL | Status: DC
  Administered 2020-03-22 – 2020-03-24 (×3): 40 mg via ORAL
  Filled 2020-03-22 (×3): qty 1

## 2020-03-22 MED ORDER — DULOXETINE HCL 30 MG PO CPEP
30.0000 mg | ORAL_CAPSULE | Freq: Every day | ORAL | Status: DC
  Administered 2020-03-22 – 2020-03-24 (×3): 30 mg via ORAL
  Filled 2020-03-22 (×4): qty 1

## 2020-03-22 MED ORDER — LACTATED RINGERS IV BOLUS (SEPSIS)
500.0000 mL | Freq: Once | INTRAVENOUS | Status: AC
  Administered 2020-03-22: 500 mL via INTRAVENOUS

## 2020-03-22 MED ORDER — ACETAMINOPHEN 325 MG PO TABS: 650.0000 mg | ORAL_TABLET | Freq: Four times a day (QID) | ORAL | Status: DC | PRN

## 2020-03-22 MED ORDER — LACTATED RINGERS IV BOLUS (SEPSIS)
1000.0000 mL | Freq: Once | INTRAVENOUS | Status: AC
  Administered 2020-03-22: 1000 mL via INTRAVENOUS

## 2020-03-22 NOTE — ED Notes (Signed)
IV fluid infusion delay due to pt having to be redirected to keep right arm straight. Attempt to find another PIV to right arm unsuccessful. Left arm in cast. Have repositioned arm and will continue to remind pt to keep arm straight to allow IVF to infuse appropriately.

## 2020-03-22 NOTE — ED Notes (Signed)
Patient's meal tray is at bedside. Daughter is at bedside.

## 2020-03-22 NOTE — ED Notes (Signed)
Patient is still eating breakfast. Will start infusion when finished. Patient's husband is at bedside. Patient is still having difficulty hearing.

## 2020-03-22 NOTE — ED Notes (Signed)
Unable to obtain second PIV successfully

## 2020-03-22 NOTE — ED Notes (Signed)
Patient c/o vaginal pain due to purewick. Purewick was repositioned. Patient's peri area is reddened.

## 2020-03-22 NOTE — ED Notes (Signed)
Pt eating breakfast with husband at bedside. Husband brought hearing aids and they are now on patient.

## 2020-03-22 NOTE — ED Notes (Signed)
Patient had small, brown, soft BM. Patient was cleaned up, new Purewick in place and new brief. Patient was repositioned on stretcher.

## 2020-03-22 NOTE — ED Notes (Signed)
MD [facility] [employer] aware of BP high BP. MDRequesting med rec completed and then will review meds and reorder them.

## 2020-03-22 NOTE — ED Notes (Signed)
Daughter remains at bedside. Patient has one hearing aid in her ear. The other was given to her husband by this Probation officer before he left. Patient is dozing on and off.

## 2020-03-22 NOTE — ED Notes (Signed)
Patient had medium, soft, brown, formed BM. Patient was cleaned up, new Purewick in place, new briefs. Patient was repositioned on stretcher. Redness noted in peri area.

## 2020-03-22 NOTE — ED Notes (Signed)
Report given to Gracie RN.

## 2020-03-22 NOTE — ED Notes (Signed)
Barrier cream placed on patient's peri-area and now is able to tolerate Purewick better.

## 2020-03-22 NOTE — ED Notes (Signed)
Husband at bedside.  

## 2020-03-22 NOTE — ED Notes (Signed)
Pt brief changed at this time, new purwick in place

## 2020-03-22 NOTE — ED Provider Notes (Signed)
[facility] Emergency Department Provider Note  ____________________________________________   First MD Initiated Contact with Patient 03/22/20 [phone]     (approximate)  I have reviewed the triage vital signs and the nursing notes.   HISTORY  Chief Complaint Altered Mental Status  Level 5 caveat:  history/ROS limited by chronic dementia  HPI Sharon Dickson is a 86 y.o. female with medical history as listed below which notably includes Lewy body dementia.  She presents by EMS for evaluation of worsening mental status.  Very limited details are available.  Reportedly the husband told the paramedics that she is being treated for urinary tract infection but that she seems to be getting steadily worse over the last couple of days including her mental status.  No other signs or symptoms were provided.  The husband is not present in the emergency department and was unavailable by phone when I tried to call him.  The patient is not able to provide any history.  She denies pain and denies shortness of breath but otherwise says " I don't know" when I ask her any questions although she is calm and pleasant.  She says she would prefer to just sleep.  She has a history of falls and has a cast on her left forearm.  She has no obvious traumatic injuries at this time.        Past Medical History:  Diagnosis Date  . Allergic rhinitis   . Anemia   . Anxiety   . Benign essential tremor   . Bipolar disorder ([place])   . Cancer ([place])    skin ca  . Chronic kidney disease   . Cognitive impairment   . Cystic disease of breast   . Cystocele   . Depression   . Diabetes mellitus without complication ([place])   . Diverticulosis   . GERD (gastroesophageal reflux disease)   . Hyperlipidemia   . Hypertension   . Lewy body dementia ([place])    Dx 3 yrs ago, per husband.   . Proteinuria   . SVT (supraventricular tachycardia) ([place])   . UTI (urinary tract infection)   . Vitamin  B 12 deficiency   . Wears dentures    partial lower    Patient Active Problem List   Diagnosis Date Noted  . Allergic rhinitis 09/08/2019  . Anxiety 09/08/2019  . B12 deficiency 09/08/2019  . Benign essential tremor 09/08/2019  . Vitamin D deficiency 09/08/2019  . Proteinuria 09/08/2019  . Hypertension associated with type 2 diabetes mellitus ([place]) 09/08/2019  . GERD (gastroesophageal reflux disease) 09/08/2019  . Irritable bowel syndrome with both constipation and diarrhea 08/15/2018  . 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 Recurrent major depressive disorder, in partial remission ([place]) 07/03/2018  . Noncompliance with medications 07/03/2018  . Degenerative disc disease, cervical 08/19/2017  . Chronic right-sided low back pain with right-sided sciatica 12/27/2016  . Dysphagia, pharyngeal phase 07/10/2016  . Recurrent falls 10/24/2015  . Gait instability 10/24/2015  . Dizziness 10/15/2015  . Melena   . GIB (gastrointestinal bleeding) 04/13/2015  . Anemia 04/13/2015  . Syncope and collapse 02/16/2015  . History of adenomatous polyp of colon 11/09/2014  . Chest pain 07/07/2014  . Long-term insulin use ([place]) 03/25/2014  . Lewy body dementia ([place]) 01/07/2014  . Chronic kidney disease, stage III (moderate) 12/08/2013  . Type 2 or unspecified type diabetes mellitus with renal manifestations 12/08/2013  . Hyperlipidemia associated with type 2 diabetes mellitus ([place]) 12/08/2013    Past Surgical  History:  Procedure Laterality Date  . ABDOMINAL HYSTERECTOMY    . APPENDECTOMY    . BLADDER SURGERY  10/2008  . CATARACT EXTRACTION W/PHACO Right 08/03/2015   Procedure: CATARACT EXTRACTION PHACO AND INTRAOCULAR LENS PLACEMENT (IOC);  Surgeon: Leandrew Koyanagi, MD;  Location: [facility];  Service: Ophthalmology;  Laterality: Right;  DIABETIC - insulin  . CATARACT EXTRACTION W/PHACO Left 09/28/2015   Procedure: CATARACT EXTRACTION PHACO AND INTRAOCULAR LENS PLACEMENT (IOC);  Surgeon:  Leandrew Koyanagi, MD;  Location: [place];  Service: Ophthalmology;  Laterality: Left;  DIABETIC LEAVE PT 3RD  . CHOLECYSTECTOMY    . COLONOSCOPY  11/09/2014  . COLONOSCOPY WITH PROPOFOL N/A 04/05/2015   Procedure: COLONOSCOPY WITH PROPOFOL;  Surgeon: Lollie Sails, MD;  Location[facility] ENDOSCOPY;  Service: Endoscopy;  Laterality: N/A;    Prior to Admission medications   Medication Sig Start Date End Date Taking? Authorizing Provider  acetaminophen (TYLENOL) 650 MG CR tablet Take by mouth.    [provider]  amLODipine (NORVASC) 5 MG tablet Take 5 mg by mouth daily. 07/08/19   [provider]  aspirin EC 325 MG EC tablet Take 1 tablet (325 mg total) by mouth daily. 10/17/15   Hillary Bow, MD  cholecalciferol (VITAMIN D) 1000 UNITS tablet Take 1,000 Units by mouth daily.    [provider]  fexofenadine (ALLEGRA) 180 MG tablet Take 180 mg by mouth daily. 08/14/19   [provider]  fluticasone (FLONASE) 50 MCG/ACT nasal spray Place 2 sprays into both nostrils daily as needed for rhinitis.     [provider]  glucose blood (PRECISION QID TEST) test strip Use once daily As directed 08/31/19   [provider]  Insulin Glargine (LANTUS SOLOSTAR) 100 UNIT/ML Solostar Pen Inject into the skin. 07/08/19   [provider]  Insulin Pen Needle (FIFTY50 PEN NEEDLES) 32G X 4 MM MISC Use 1 each once daily 07/28/18   [provider]  JARDIANCE 10 MG TABS tablet Take 10 mg by mouth daily. 02/18/20   [provider]  losartan (COZAAR) 100 MG tablet Take 100 mg by mouth daily. 07/08/19   [provider]  Multiple Vitamins-Minerals (CENTRUM SILVER 50+WOMEN PO) Take by mouth.    [provider]  multivitamin-iron-minerals-folic acid (CENTRUM) chewable tablet Chew 1 tablet by mouth daily.    [provider]  omeprazole (PRILOSEC) 20 MG capsule Take 20 mg by mouth at bedtime.     [provider]  propranolol (INDERAL) 40 MG tablet Take 40 mg by mouth 2 (two) times daily.    [provider]  vitamin E 400 UNIT capsule Take 400 Units by mouth daily.    [provider]  gabapentin (NEURONTIN) 100 MG capsule Take 100 mg by mouth 3 (three) times daily. 04/25/19 03/04/20  [provider]  glimepiride (AMARYL) 2 MG tablet Take 2 mg by mouth daily. 07/08/19 03/04/20  [provider]  loratadine (CLARITIN) 10 MG tablet Take by mouth.  03/04/20  [provider]    Allergies Ceftin [cefuroxime axetil], Codeine, Metformin, Paxil [paroxetine hcl], and Prozac [fluoxetine hcl]  Family History  Problem Relation Age of Onset  . Breast cancer Daughter 67    Social History Social History   Tobacco Use  . Smoking status: Never Smoker  . Smokeless tobacco: Never Used  Substance Use Topics  . Alcohol use: No  . Drug use: No    Review of Systems Level 5 caveat:  history/ROS limited  by chronic dementia    ____________________________________________   PHYSICAL EXAM:  VITAL SIGNS: ED Triage Vitals  Enc Vitals Group     BP 03/21/20 2034 (!) 153/63     Pulse Rate 03/21/20 2034 91     Resp 03/21/20 2034 18     Temp 03/21/20 2034 97.8 F (36.6 C)     Temp Source 03/21/20 2034 Oral     SpO2 03/21/20 1948 98 %     Weight 03/21/20 2034 47.2 kg (104 lb)     Height 03/21/20 2034 1.626 m (5\' 4" )     Head Circumference --      Peak Flow --      Pain Score --      Pain Loc --      Pain Edu? --      Excl. in [place]? --     Constitutional: Sleeping.  Awakens with soft voice and light touch and she is oriented to her name but nothing else. Eyes: Conjunctivae are normal.  Head: Atraumatic. Nose: No congestion/rhinnorhea. Mouth/Throat: Patient is wearing a mask.  Mucous membranes are dry. Neck: No stridor.  No meningeal signs.   Cardiovascular: Mild tachycardia, regular rhythm. Good peripheral circulation. Grossly normal heart  sounds. Respiratory: Normal respiratory effort.  No retractions. Gastrointestinal: Soft and nontender. No distention.  Musculoskeletal: Left forearm cast in place.  Palpable compartments are soft.  No lower extremity tenderness nor edema.  Neurologic:  Normal speech and language but unable to follow commands for neurological exam due to confusion.  However she is moving all 4 extremities and has no obvious facial nerve deficits. Skin:  Skin is warm, dry and intact.   ____________________________________________   LABS (all labs ordered are listed, but only abnormal results are displayed)  Labs Reviewed  COMPREHENSIVE METABOLIC PANEL - Abnormal; Notable for the following components:      Result Value   Sodium 133 (*)    Potassium 5.2 (*)    CO2 18 (*)    Glucose, Bld 438 (*)    BUN 42 (*)    Creatinine, Ser 1.63 (*)    Total Bilirubin 1.8 (*)    GFR calc non Af Amer 29 (*)    GFR calc Af Amer 33 (*)    All other components within normal limits  URINALYSIS, COMPLETE (UACMP) WITH MICROSCOPIC - Abnormal; Notable for the following components:   Color, Urine YELLOW (*)    APPearance CLOUDY (*)    Glucose, UA >=500 (*)    Ketones, ur 5 (*)    Protein, ur 100 (*)    Leukocytes,Ua TRACE (*)    Bacteria, UA RARE (*)    All other components within normal limits  TROPONIN I (HIGH SENSITIVITY) - Abnormal; Notable for the following components:   Troponin I (High Sensitivity) 63 (*)    All other components within normal limits  TROPONIN I (HIGH SENSITIVITY) - Abnormal; Notable for the following components:   Troponin I (High Sensitivity) 85 (*)    All other components within normal limits  SARS CORONAVIRUS 2 BY RT PCR (HOSPITAL ORDER, [place])  CULTURE, BLOOD (ROUTINE X 2)  CULTURE, BLOOD (ROUTINE X 2)  URINE CULTURE  CBC  AMMONIA  TSH  LACTIC ACID, PLASMA  LACTIC ACID, PLASMA  PROCALCITONIN   ____________________________________________  EKG  ED  ECG REPORT I, Hinda Kehr, the attending physician, personally viewed and interpreted this ECG.  Date: 03/21/2020 EKG Time: 20:53 Rate: 80 Rhythm: normal  sinus rhythm QRS Axis: normal Intervals: normal ST/T Wave abnormalities: Non-specific ST segment / T-wave changes, but no clear evidence of acute ischemia. Narrative Interpretation: no definitive evidence of acute ischemia; does not meet STEMI criteria.   ____________________________________________  RADIOLOGY I, Hinda Kehr, personally viewed and evaluated these images (plain radiographs) as part of my medical decision making, as well as reviewing the written report by the radiologist.  ED MD interpretation: No acute abnormality identified on CT head.  No acute abnormalities on CXR.  CT renal stone protocol pending at time of signout.  Official radiology report(s): CT Head Wo Contrast  Result Date: 03/21/2020 CLINICAL DATA:  Mental status change, unknown cause EXAM: CT HEAD WITHOUT CONTRAST TECHNIQUE: Contiguous axial images were obtained from the base of the skull through the vertex without intravenous contrast. COMPARISON:  CT 06/25/2019 FINDINGS: Brain: No evidence of acute infarction, hemorrhage, hydrocephalus, extra-axial collection or mass lesion/mass effect. Symmetric prominence of the ventricles, cisterns and sulci compatible with moderate parenchymal volume loss and ex vacuo dilatation of the ventricles, overall similar to comparison. Patchy areas of white matter hypoattenuation are most compatible with chronic microvascular angiopathy. Vascular: Atherosclerotic calcification of the carotid siphons. No hyperdense vessel. Skull: No calvarial fracture or suspicious osseous lesion. No scalp swelling or hematoma. Sinuses/Orbits: Paranasal sinuses and mastoid air cells are predominantly clear. Included orbital structures are unremarkable. Other: None. IMPRESSION: 1. No acute intracranial findings. 2. Stable moderate parenchymal volume  loss and chronic microvascular angiopathy. Electronically Signed   By: Lovena Le M.D.   On: 03/21/2020 22:27   DG Chest Port 1 View  Result Date: 03/22/2020 CLINICAL DATA:  Questionable sepsis. EXAM: PORTABLE CHEST 1 VIEW COMPARISON:  07/08/2018. FINDINGS: Mediastinum hilar structures normal. Mild bilateral subsegmental atelectasis and or scarring again noted. No focal alveolar infiltrate. No pleural effusion or pneumothorax. Heart size stable. Old right clavicular fracture again noted. IMPRESSION: Mild bilateral subsegmental atelectasis and or scarring again noted. No acute infiltrate noted. Electronically Signed   By: Marcello Moores  Register   On: 03/22/2020 07:02    ____________________________________________   PROCEDURES   Procedure(s) performed (including Critical Care):  Procedures   ____________________________________________   INITIAL IMPRESSION / MDM / Thermal / ED COURSE  As part of my medical decision making, I reviewed the following data within the [facility] notes reviewed and incorporated, Labs reviewed , EKG interpreted , Old chart reviewed, Patient signed out to , Radiograph reviewed  and Notes from prior ED visits   Differential diagnosis includes, but is not limited to, delirium, persistent urinary tract infection, acute kidney injury, obstructive uropathy, pyelonephritis.  The patient does not meet criteria for sepsis at this point with a very minimal elevation in her heart rate and no leukocytosis.  However her urinalysis remains positive in spite of reported antibiotic treatment.  Comprehensive metabolic panel is concerning with very slight elevation of her potassium and BUN and creatinine elevations consistent with dehydration and acute kidney injury.  Her GFR is significantly reduced from baseline.  Lactic acid and procalcitonin are both pending and I ordered blood cultures as well.  I am treating empirically with ceftriaxone 1 g IV  and I ordered 30 mL/kg of lactated Ringer's which calculates to 1.5 L of fluid.  She has 2 elevated troponins, increasing from 63-85, but I believe this is likely demand ischemia in the setting of her other acute issues rather than representative of NSTEMI.  I will obtain a CT renal stone protocol to  evaluate for the possibility of acute intra-abdominal pathology or obstructive uropathy requiring urgent urological intervention.  Otherwise the patient will be admitted to the hospital service for additional care and treatment.       Clinical Course as of Mar 22 734  Tue Mar 22, 2020  0727 Transferring ED care to Dr. Ellender Hose to follow up CT and admit to hospitalist service.   [CF]    Clinical Course User Index [CF] Hinda Kehr, MD     ____________________________________________  FINAL CLINICAL IMPRESSION(S) / ED DIAGNOSES  Final diagnoses:  Acute kidney injury ([place])  Dehydration  Urinary tract infection without hematuria, site unspecified  Altered mental status, unspecified altered mental status type  Elevated troponin level     MEDICATIONS GIVEN DURING THIS VISIT:  Medications  lactated ringers bolus 1,000 mL (has no administration in time range)    And  lactated ringers bolus 500 mL (has no administration in time range)  cefTRIAXone (ROCEPHIN) 1 g in sodium chloride 0.9 % 100 mL IVPB (has no administration in time range)  ondansetron (ZOFRAN) injection 4 mg (has no administration in time range)     ED Discharge Orders    None      *Please note:  Sharon Dickson was evaluated in Emergency Department on 03/22/2020 for the symptoms described in the history of present illness. She was evaluated in the context of the global COVID-19 pandemic, which necessitated consideration that the patient might be at risk for infection with the SARS-CoV-2 virus that causes COVID-19. Institutional protocols and algorithms that pertain to the evaluation of patients at risk for COVID-19  are in a state of rapid change based on information released by regulatory bodies including the CDC and federal and state organizations. These policies and algorithms were followed during the patient's care in the ED.  Some ED evaluations and interventions may be delayed as a result of limited staffing during and after the pandemic.*  Note:  This document was prepared using Dragon voice recognition software and may include unintentional dictation errors.   Hinda Kehr, MD 03/22/20 [phone]

## 2020-03-22 NOTE — H&P (Addendum)
History and PhysicalMaelee Hoot Dickson   SWH:[id number] DOB: 1933-10-05 DOA: 03/22/2020  Referring MD/provider: Dr Ellender Hose PCP: Ezequiel Kayser, MD   Patient coming from: Home  Chief Complaint: Weakness per husband.  History is per conversation with patient's husband over the phone.  History of Present Illness:   Sharon Dickson is an 87 y.o. female with PMH significant for Lewy body dementia, HTN, DM2, CKD 3, anxiety and chronic UTIs who is brought in by her husband for weakness for the past 24 to 48 hours.  At baseline patient apparently has pretty significant dementia, sometimes is unable to recognize her husband however she is physically mobile and is able to go to the bathroom herself.  3 weeks ago she fell and injured her wrist and since then has been much more sedentary but is still been walking to the bathroom and kitchen.  Last week she was treated for a urinary tract infection by her PCP at [facility].  2 to 3 days ago patient's husband noted that she was markedly more lethargic.  She was not eating and not taking her medications.  He noted "I had to almost lift her to get her to the bathroom".  She continues to be confused but he believes this is most likely from dementia.  It is her lethargy and weakness and unwillingness to cooperate that is concerning to him.  He is not sure why there has been a change in her symptomatology.  He denies that she has had any fevers or chills or sweats.  He has not had a new cough according to him.  He has not had diarrhea as far as he is aware.  He has not had any vomiting and as noted previously has had decreased p.o. intake over the past 24 to 48 hours.  ED Course:  The patient was noted to be afebrile and markedly hypertensive.  She was saturating well on room air.  She was unable to provide a history.  Physical exam was unrevealing.  Laboratory data were suggestive of dehydration with new AKI.  Urinalysis was noted to be dirty  however it had multiple squamous epithelial cells.  Ceftriaxone was nonetheless ordered.  She was given 1 L LR.  Troponin was noted to be elevated from 63 up to 85.  She underwent a renal stone protocol CT which was negative for renal stone or obstructive uropathy.  ROS:   ROS   Review of Systems: Unable to provide complete ROS due to dementia  Patient does however specifically deny any pain anywhere in her body, she states she is cold, denies specifically chest pain or difficulty breathing.  Past Medical History:   Past Medical History:  Diagnosis Date  . Allergic rhinitis   . Anemia   . Anxiety   . Benign essential tremor   . Bipolar disorder ([place])   . Cancer ([place])    skin ca  . Chronic kidney disease   . Cognitive impairment   . Cystic disease of breast   . Cystocele   . Depression   . Diabetes mellitus without complication ([place])   . Diverticulosis   . GERD (gastroesophageal reflux disease)   . Hyperlipidemia   . Hypertension   . Lewy body dementia ([place])    Dx 3 yrs ago, per husband.   . Proteinuria   . SVT (supraventricular tachycardia) ([place])   . UTI (urinary tract infection)   . Vitamin B 12 deficiency   .  Wears dentures    partial lower    Past Surgical History:   Past Surgical History:  Procedure Laterality Date  . ABDOMINAL HYSTERECTOMY    . APPENDECTOMY    . BLADDER SURGERY  10/2008  . CATARACT EXTRACTION W/PHACO Right 08/03/2015   Procedure: CATARACT EXTRACTION PHACO AND INTRAOCULAR LENS PLACEMENT (IOC);  Surgeon: Leandrew Koyanagi, MD;  Location: [facility];  Service: Ophthalmology;  Laterality: Right;  DIABETIC - insulin  . CATARACT EXTRACTION W/PHACO Left 09/28/2015   Procedure: CATARACT EXTRACTION PHACO AND INTRAOCULAR LENS PLACEMENT (IOC);  Surgeon: Leandrew Koyanagi, MD;  Location: [facility];  Service: Ophthalmology;  Laterality: Left;  DIABETIC LEAVE PT 3RD  . CHOLECYSTECTOMY    . COLONOSCOPY  11/09/2014  . COLONOSCOPY WITH  PROPOFOL N/A 04/05/2015   Procedure: COLONOSCOPY WITH PROPOFOL;  Surgeon: Lollie Sails, MD;  Location: [facility] ENDOSCOPY;  Service: Endoscopy;  Laterality: N/A;    Social History:   Social History   Socioeconomic History  . Marital status: Married    Spouse name: Not on file  . Number of children: Not on file  . Years of education: Not on file  . Highest education level: Not on file  Occupational History  . Not on file  Tobacco Use  . Smoking status: Never Smoker  . Smokeless tobacco: Never Used  Substance and Sexual Activity  . Alcohol use: No  . Drug use: No  . Sexual activity: Not Currently    Birth control/protection: Post-menopausal  Other Topics Concern  . Not on file  Social History Narrative  . Not on file   Social Determinants of Health   Financial Resource Strain:   . Difficulty of Paying Living Expenses: Not on file  Food Insecurity:   . Worried About Charity fundraiser in the Last Year: Not on file  . Ran Out of Food in the Last Year: Not on file  Transportation Needs:   . Lack of Transportation (Medical): Not on file  . Lack of Transportation (Non-Medical): Not on file  Physical Activity:   . Days of Exercise per Week: Not on file  . Minutes of Exercise per Session: Not on file  Stress:   . Feeling of Stress : Not on file  Social Connections:   . Frequency of Communication with Friends and Family: Not on file  . Frequency of Social Gatherings with Friends and Family: Not on file  . Attends Religious Services: Not on file  . Active Member of Clubs or Organizations: Not on file  . Attends Archivist Meetings: Not on file  . Marital Status: Not on file  Intimate Partner Violence:   . Fear of Current or Ex-Partner: Not on file  . Emotionally Abused: Not on file  . Physically Abused: Not on file  . Sexually Abused: Not on file    Allergies   Ceftin [cefuroxime axetil], Codeine, Metformin, Paxil [paroxetine hcl], and Prozac [fluoxetine  hcl]  Family history:   Family History  Problem Relation Age of Onset  . Breast cancer Daughter 98    Current Medications:   Prior to Admission medications   Medication Sig Start Date End Date Taking? Authorizing Provider  acetaminophen (TYLENOL) 650 MG CR tablet Take by mouth.    [provider]  amLODipine (NORVASC) 5 MG tablet Take 5 mg by mouth daily. 07/08/19   [provider]  aspirin EC 325 MG EC tablet Take 1 tablet (325 mg total) by mouth daily. 10/17/15  Hillary Bow, MD  cholecalciferol (VITAMIN D) 1000 UNITS tablet Take 1,000 Units by mouth daily.    [provider]  fexofenadine (ALLEGRA) 180 MG tablet Take 180 mg by mouth daily. 08/14/19   [provider]  fluticasone (FLONASE) 50 MCG/ACT nasal spray Place 2 sprays into both nostrils daily as needed for rhinitis.     [provider]  Insulin Glargine (LANTUS SOLOSTAR) 100 UNIT/ML Solostar Pen Inject into the skin. 07/08/19   [provider]  JARDIANCE 10 MG TABS tablet Take 10 mg by mouth daily. 02/18/20   [provider]  losartan (COZAAR) 100 MG tablet Take 100 mg by mouth daily. 07/08/19   [provider]  Multiple Vitamins-Minerals (CENTRUM SILVER 50+WOMEN PO) Take by mouth.    [provider]  multivitamin-iron-minerals-folic acid (CENTRUM) chewable tablet Chew 1 tablet by mouth daily.    [provider]  omeprazole (PRILOSEC) 20 MG capsule Take 20 mg by mouth at bedtime.     [provider]  propranolol (INDERAL) 40 MG tablet Take 40 mg by mouth 2 (two) times daily.    [provider]  vitamin E 400 UNIT capsule Take 400 Units by mouth daily.    [provider]  gabapentin (NEURONTIN) 100 MG capsule Take 100 mg by mouth 3 (three) times daily. 04/25/19 03/04/20  [provider]  glimepiride (AMARYL) 2 MG tablet Take 2 mg by mouth daily. 07/08/19 03/04/20  [provider]  loratadine  (CLARITIN) 10 MG tablet Take by mouth.  03/04/20  [provider]    Physical Exam:   Vitals:   03/21/20 1948 03/21/20 2034 03/22/20 0054 03/22/20 0645  BP:  (!) 153/63 (!) 162/95 133/86  Pulse:  91 (!) 113 94  Resp:  18 16 18   Temp:  97.8 F (36.6 C) 98.3 F (36.8 C)   TempSrc:  Oral    SpO2: 98% 97% 96% 95%  Weight:  47.2 kg    Height:  5\' 4"  (1.626 m)       Physical Exam: Blood pressure 133/86, pulse 94, temperature 98.3 F (36.8 C), resp. rate 18, height 5\' 4"  (1.626 m), weight 47.2 kg, SpO2 95 %. Gen: Awake and alert female lying flat in bed in no acute distress appearing somewhat blank. Eyes: sclera anicteric, conjuctiva clear CVS: S1-S2, regulary, no gallops Respiratory: Good air entry bilaterally with no adventitious sounds  GI: NABS, soft, no tenderness to light or deep palpation, no CVA tenderness. LE: No edema. No cyanosis Neuro: grossly nonfocal other than dementia and decreased hearing.   Data Review:    Labs: Basic Metabolic Panel: Recent Labs  Lab 03/21/20 2054  NA 133*  K 5.2*  CL 100  CO2 18*  GLUCOSE 438*  BUN 42*  CREATININE 1.63*  CALCIUM 9.5   Liver Function Tests: Recent Labs  Lab 03/21/20 2054  AST 19  ALT 14  ALKPHOS 73  BILITOT 1.8*  PROT 7.3  ALBUMIN 3.8   No results for input(s): LIPASE, AMYLASE in the last 168 hours. Recent Labs  Lab 03/22/20 0400  AMMONIA 22   CBC: Recent Labs  Lab 03/21/20 2054  WBC 10.2  HGB 13.5  HCT 39.1  MCV 96.1  PLT 377   Cardiac Enzymes: No results for input(s): CKTOTAL, CKMB, CKMBINDEX, TROPONINI in the last 168 hours.  BNP (last 3 results) No results for input(s): PROBNP in the last 8760 hours. CBG: No results for input(s): GLUCAP in the last 168 hours.  Urinalysis    Component Value Date/Time   COLORURINE YELLOW (A) 03/21/2020 2210   APPEARANCEUR CLOUDY (A) 03/21/2020 2210   LABSPEC 1.023 03/21/2020 2210   PHURINE 5.0 03/21/2020 2210   GLUCOSEU >=500 (A)  03/21/2020 2210   HGBUR NEGATIVE 03/21/2020 2210   BILIRUBINUR NEGATIVE 03/21/2020 2210   KETONESUR 5 (A) 03/21/2020 2210   PROTEINUR 100 (A) 03/21/2020 2210   NITRITE NEGATIVE 03/21/2020 2210   LEUKOCYTESUR TRACE (A) 03/21/2020 2210      Radiographic Studies: CT Head Wo Contrast  Result Date: 03/21/2020 CLINICAL DATA:  Mental status change, unknown cause EXAM: CT HEAD WITHOUT CONTRAST TECHNIQUE: Contiguous axial images were obtained from the base of the skull through the vertex without intravenous contrast. COMPARISON:  CT 06/25/2019 FINDINGS: Brain: No evidence of acute infarction, hemorrhage, hydrocephalus, extra-axial collection or mass lesion/mass effect. Symmetric prominence of the ventricles, cisterns and sulci compatible with moderate parenchymal volume loss and ex vacuo dilatation of the ventricles, overall similar to comparison. Patchy areas of white matter hypoattenuation are most compatible with chronic microvascular angiopathy. Vascular: Atherosclerotic calcification of the carotid siphons. No hyperdense vessel. Skull: No calvarial fracture or suspicious osseous lesion. No scalp swelling or hematoma. Sinuses/Orbits: Paranasal sinuses and mastoid air cells are predominantly clear. Included orbital structures are unremarkable. Other: None. IMPRESSION: 1. No acute intracranial findings. 2. Stable moderate parenchymal volume loss and chronic microvascular angiopathy. Electronically Signed   By: Lovena Le M.D.   On: 03/21/2020 22:27   DG Chest Port 1 View  Result Date: 03/22/2020 CLINICAL DATA:  Questionable sepsis. EXAM: PORTABLE CHEST 1 VIEW COMPARISON:  07/08/2018. FINDINGS: Mediastinum hilar structures normal. Mild bilateral subsegmental atelectasis and or scarring again noted. No focal alveolar infiltrate. No pleural effusion or pneumothorax. Heart size stable. Old right clavicular fracture again noted. IMPRESSION: Mild bilateral subsegmental atelectasis and or scarring again noted.  No acute infiltrate noted. Electronically Signed   By: Marcello Moores  Register   On: 03/22/2020 07:02   CT Renal Stone Study  Result Date: 03/22/2020 CLINICAL DATA:  Recent UTI. EXAM: CT ABDOMEN AND PELVIS WITHOUT CONTRAST TECHNIQUE: Multidetector CT imaging of the abdomen and pelvis was performed following the standard protocol without IV contrast. COMPARISON:  04/13/2015 FINDINGS: Lower chest: Moderate to large hiatal hernia. Hepatobiliary: No focal abnormality in the liver on this study without intravenous contrast. Nonvisualization of the gallbladder suggest prior cholecystectomy. Extrahepatic common bile duct measures 8 mm diameter, stable since prior study from 5 years ago. Pancreas: No focal mass lesion. No dilatation of the main duct. No intraparenchymal cyst. No peripancreatic edema. Spleen: No splenomegaly. No focal mass lesion. Adrenals/Urinary Tract: No adrenal nodule or mass. No evidence for renal stones. No gross renal mass lesion on this noncontrast exam. No hydroureteronephrosis. No ureteral stones. Prominent amount of gas identified in the urinary bladder. No substantial bladder wall thickening appreciated on this noncontrast study. Stomach/Bowel: Moderate to large hiatal hernia with approximately 25-50% of the stomach in the chest. Duodenum is normally positioned as is the ligament of Treitz. No small bowel wall thickening. No small bowel dilatation. The terminal ileum is normal. The appendix is not visualized, but there is no edema or inflammation in the region of the cecum. No gross colonic mass. No colonic wall thickening. Diverticular changes are noted in the left colon without evidence of diverticulitis. Vascular/Lymphatic: There is abdominal aortic atherosclerosis without aneurysm. There is no gastrohepatic or hepatoduodenal ligament lymphadenopathy. No retroperitoneal or mesenteric lymphadenopathy. No pelvic sidewall lymphadenopathy. Reproductive: Uterus surgically absent.  There is no adnexal  mass. Other: No intraperitoneal free fluid. Musculoskeletal: No worrisome lytic or sclerotic osseous abnormality. Degenerative disc disease noted lumbar spine. IMPRESSION: 1. Prominent amount of gas identified in the urinary bladder. This is presumably related to recent instrumentation although infection could have this appearance. 2. No evidence for urinary stone disease. No hydroureteronephrosis. 3. Extrahepatic common bile duct dilatation up to 8 mm, unchanged since 04/13/2015. 4. Moderate to large hiatal hernia. 5. Left colonic diverticulosis without diverticulitis. 6. Aortic Atherosclerosis (ICD10-I70.0). Electronically Signed   By: Misty Stanley M.D.   On: 03/22/2020 07:42    EKG: Independently reviewed.  NSR at 80.  Normal axis.  IVCD nonspecific.  P pulmonale.  Some scooping of ST segments 2, 3, F and maybe 1 mm ST depressions in V5.  Assessment/Plan:   Principal Problem:   Acute metabolic encephalopathy Active Problems:   Anxiety   Chronic kidney disease, stage III (moderate)   Lewy body dementia (HCC)   Hypertension associated with type 2 diabetes mellitus (HCC)   Hyperlipidemia associated with type 2 diabetes mellitus (HCC)   Gait instability  86 yo female with dementia and history of chronic UTIs brought in by husband for weakness for the past 2 days, found to be dehydrated with acute kidney injury.  Weakness  Most likely secondary to intravascular volume depletion leading to acute kidney injury Unclear why patient is dehydrated, she has had decreased p.o. intake over the past 2 days but it is unclear why her p.o. intake decreased.  Of note patient has been treated for UTI and UA here was initially dirty but had multiple squamous epithelial cells.  Repeat UA shows multiple bacteria but no leukoesterase no WBCs and no nitrites.  Of note patient does have significant hyperglycemia and patient's husband does admit that she has not been taking her diabetes medications for several days.   Potentially patient has had a diuresis from significant hyperglycemia leading to intravascular volume depletion and kidney injury. We will follow patient with hydration and hope to see improvement. Physical therapy when patient is clinically somewhat improved.  Chronic UTIs I do not believe patient has UTI at present as repeat UA here shows no leukoesterase, no WBC no nitrites although she does have multiple bacteria.  Patient is known to have asymptomatic bacteriuria alternating with UTI.  She received 1 dose of ceftriaxone in the ED, will not continue. Of note patient had previously been on Jardiance, this probably needs to be discontinued upon discharge if she is still on it, awaiting med rec.  Acute on Chronic kidney disease Patient's medicines have not been reconciled but in the past she had been on losartan.  Possibly kidney injury secondary to intravascular volume depletion in the setting of ARB. Will hold any ACE or ARB that she might be on, hydrate patient, avoid nephrotoxins and hope to see improvement.  Hyponatremia Should improve with hydration, will follow  Mild Hyperkalemia  Hold ACEI/ARB, hydrate and follow  DM with hyperglycemia We will place patient on SSI ACHS and follow Hold Jardiance and glargine for now  HTN Awaiting medicine reconciliation--amlodipine and propranolol were continued, hold losartan  Lewy Body Dementia Slowly progressive per patient's husband. Continue Cymbalta    Other information:   DVT prophylaxis: Enoxaparin ordered. Code Status: Full Family Communication: Spoke at length with patient's husband, use home phone (not work phone) number Disposition Plan: Home Consults called: None Admission status: Observation  Salley Boxley Tublu Lexine Jaspers [facility]  If 7PM-7AM, please contact night-coverage  [email or web address] Password [facility] 03/22/2020, 8:18 AM

## 2020-03-22 NOTE — ED Notes (Signed)
PT blood sugar was 300

## 2020-03-23 DIAGNOSIS — N390 Urinary tract infection, site not specified: Secondary | ICD-10-CM | POA: Diagnosis present

## 2020-03-23 DIAGNOSIS — K219 Gastro-esophageal reflux disease without esophagitis: Secondary | ICD-10-CM | POA: Diagnosis present

## 2020-03-23 DIAGNOSIS — N183 Chronic kidney disease, stage 3 unspecified: Secondary | ICD-10-CM

## 2020-03-23 DIAGNOSIS — G3183 Dementia with Lewy bodies: Secondary | ICD-10-CM | POA: Diagnosis present

## 2020-03-23 DIAGNOSIS — N179 Acute kidney failure, unspecified: Secondary | ICD-10-CM | POA: Diagnosis present

## 2020-03-23 DIAGNOSIS — I10 Essential (primary) hypertension: Secondary | ICD-10-CM

## 2020-03-23 DIAGNOSIS — K449 Diaphragmatic hernia without obstruction or gangrene: Secondary | ICD-10-CM | POA: Diagnosis present

## 2020-03-23 DIAGNOSIS — Z9114 Patient's other noncompliance with medication regimen: Secondary | ICD-10-CM | POA: Diagnosis not present

## 2020-03-23 DIAGNOSIS — R2689 Other abnormalities of gait and mobility: Secondary | ICD-10-CM | POA: Diagnosis present

## 2020-03-23 DIAGNOSIS — E860 Dehydration: Secondary | ICD-10-CM

## 2020-03-23 DIAGNOSIS — E1159 Type 2 diabetes mellitus with other circulatory complications: Secondary | ICD-10-CM

## 2020-03-23 DIAGNOSIS — R7989 Other specified abnormal findings of blood chemistry: Secondary | ICD-10-CM | POA: Diagnosis present

## 2020-03-23 DIAGNOSIS — F0280 Dementia in other diseases classified elsewhere without behavioral disturbance: Secondary | ICD-10-CM

## 2020-03-23 DIAGNOSIS — Z9049 Acquired absence of other specified parts of digestive tract: Secondary | ICD-10-CM | POA: Diagnosis not present

## 2020-03-23 DIAGNOSIS — E1122 Type 2 diabetes mellitus with diabetic chronic kidney disease: Secondary | ICD-10-CM | POA: Diagnosis present

## 2020-03-23 DIAGNOSIS — F319 Bipolar disorder, unspecified: Secondary | ICD-10-CM | POA: Diagnosis present

## 2020-03-23 DIAGNOSIS — F419 Anxiety disorder, unspecified: Secondary | ICD-10-CM | POA: Diagnosis present

## 2020-03-23 DIAGNOSIS — E1165 Type 2 diabetes mellitus with hyperglycemia: Secondary | ICD-10-CM | POA: Diagnosis present

## 2020-03-23 DIAGNOSIS — Z20822 Contact with and (suspected) exposure to covid-19: Secondary | ICD-10-CM | POA: Diagnosis present

## 2020-03-23 DIAGNOSIS — E1169 Type 2 diabetes mellitus with other specified complication: Secondary | ICD-10-CM | POA: Diagnosis present

## 2020-03-23 DIAGNOSIS — I129 Hypertensive chronic kidney disease with stage 1 through stage 4 chronic kidney disease, or unspecified chronic kidney disease: Secondary | ICD-10-CM | POA: Diagnosis present

## 2020-03-23 DIAGNOSIS — Z7982 Long term (current) use of aspirin: Secondary | ICD-10-CM | POA: Diagnosis not present

## 2020-03-23 DIAGNOSIS — G9341 Metabolic encephalopathy: Secondary | ICD-10-CM | POA: Diagnosis present

## 2020-03-23 DIAGNOSIS — E785 Hyperlipidemia, unspecified: Secondary | ICD-10-CM

## 2020-03-23 DIAGNOSIS — E871 Hypo-osmolality and hyponatremia: Secondary | ICD-10-CM | POA: Diagnosis present

## 2020-03-23 DIAGNOSIS — E875 Hyperkalemia: Secondary | ICD-10-CM | POA: Diagnosis present

## 2020-03-23 LAB — CBC
HCT: 34.6 % — ABNORMAL LOW (ref 36.0–46.0)
Hemoglobin: 11.7 g/dL — ABNORMAL LOW (ref 12.0–15.0)
MCH: 33.1 pg (ref 26.0–34.0)
MCHC: 33.8 g/dL (ref 30.0–36.0)
MCV: 97.7 fL (ref 80.0–100.0)
Platelets: 293 10*3/uL (ref 150–400)
RBC: 3.54 MIL/uL — ABNORMAL LOW (ref 3.87–5.11)
RDW: 12.4 % (ref 11.5–15.5)
WBC: 8.5 10*3/uL (ref 4.0–10.5)
nRBC: 0 % (ref 0.0–0.2)

## 2020-03-23 LAB — HEMOGLOBIN A1C
Hgb A1c MFr Bld: 11.9 % — ABNORMAL HIGH (ref 4.8–5.6)
Mean Plasma Glucose: 294.83 mg/dL

## 2020-03-23 LAB — BASIC METABOLIC PANEL
Anion gap: 11 (ref 5–15)
BUN: 29 mg/dL — ABNORMAL HIGH (ref 8–23)
CO2: 20 mmol/L — ABNORMAL LOW (ref 22–32)
Calcium: 8.6 mg/dL — ABNORMAL LOW (ref 8.9–10.3)
Chloride: 105 mmol/L (ref 98–111)
Creatinine, Ser: 1.19 mg/dL — ABNORMAL HIGH (ref 0.44–1.00)
GFR calc Af Amer: 49 mL/min — ABNORMAL LOW (ref >60)
GFR calc non Af Amer: 42 mL/min — ABNORMAL LOW (ref >60)
Glucose, Bld: 287 mg/dL — ABNORMAL HIGH (ref 70–99)
Potassium: 4.4 mmol/L (ref 3.5–5.1)
Sodium: 136 mmol/L (ref 135–145)

## 2020-03-23 LAB — URINE CULTURE: Culture: <10000 — AB

## 2020-03-23 LAB — GLUCOSE, CAPILLARY
Glucose-Capillary: 220 mg/dL — ABNORMAL HIGH (ref 70–99)
Glucose-Capillary: 284 mg/dL — ABNORMAL HIGH (ref 70–99)
Glucose-Capillary: 301 mg/dL — ABNORMAL HIGH (ref 70–99)
Glucose-Capillary: 261 mg/dL — ABNORMAL HIGH (ref 70–99)
Glucose-Capillary: 210 mg/dL — ABNORMAL HIGH (ref 70–99)

## 2020-03-23 MED ORDER — INSULIN ASPART 100 UNIT/ML ~~LOC~~ SOLN
0.0000 [IU] | Freq: Three times a day (TID) | SUBCUTANEOUS | Status: DC
  Administered 2020-03-23: 7 [IU] via SUBCUTANEOUS
  Administered 2020-03-23: 5 [IU] via SUBCUTANEOUS
  Administered 2020-03-24 (×2): 3 [IU] via SUBCUTANEOUS
  Filled 2020-03-23 (×4): qty 1

## 2020-03-23 MED ORDER — SODIUM CHLORIDE 0.9 % IV SOLN: INTRAVENOUS | Status: DC

## 2020-03-23 MED ORDER — ENOXAPARIN SODIUM 30 MG/0.3ML ~~LOC~~ SOLN
30.0000 mg | SUBCUTANEOUS | Status: DC
  Administered 2020-03-23: 30 mg via SUBCUTANEOUS
  Filled 2020-03-23: qty 0.3

## 2020-03-23 MED ORDER — LIVING WELL WITH DIABETES BOOK
Freq: Once | Status: AC
  Filled 2020-03-23: qty 1

## 2020-03-23 MED ORDER — INSULIN GLARGINE 100 UNIT/ML ~~LOC~~ SOLN
8.0000 [IU] | Freq: Every day | SUBCUTANEOUS | Status: DC
  Administered 2020-03-23: 8 [IU] via SUBCUTANEOUS
  Filled 2020-03-23: qty 0.08

## 2020-03-23 MED ORDER — INSULIN GLARGINE 100 UNIT/ML ~~LOC~~ SOLN
10.0000 [IU] | Freq: Every day | SUBCUTANEOUS | Status: DC
  Administered 2020-03-24: 10 [IU] via SUBCUTANEOUS
  Filled 2020-03-23: qty 0.1

## 2020-03-23 NOTE — Progress Notes (Addendum)
Inpatient Diabetes Program Recommendations  AACE/ADA: New Consensus Statement on Inpatient Glycemic Control   Target Ranges:  Prepandial:   less than 140 mg/dL      Peak postprandial:   less than 180 mg/dL (1-2 hours)      Critically ill patients:  140 - 180 mg/dL   Results for LEVERN, KALKA (MRN [id number]) as of 03/23/2020 09:10  Ref. Range 03/22/2020 10:39 03/22/2020 13:29 03/22/2020 18:06 03/23/2020 02:31 03/23/2020 08:01  Glucose-Capillary Latest Ref Range: 70 - 99 mg/dL 365 (H) 397 (H) 300 (H) 220 (H) 284 (H)  Results for SHARIA, AVERITT (MRN [id number]) as of 03/23/2020 09:10  Ref. Range 03/21/2020 20:54  Glucose Latest Ref Range: 70 - 99 mg/dL 438 (H)   Results for RAKEYA, GLAB (MRN [id number]) as of 03/23/2020 09:10  Ref. Range 03/22/2020 10:50  Hemoglobin A1C Latest Ref Range: 4.8 - 5.6 % 11.9 (H)   Review of Glycemic Control  Diabetes history: DM2 Outpatient Diabetes medications: Lantus 5 units QHS, Jardiance 10 mg daily Current orders for Inpatient glycemic control: Novolog 0-9 units TID with meals  Inpatient Diabetes Program Recommendations:    Insulin: Please consider ordering Lantus 10 units Q24H and Novolog 0-5 units QHS for bedtime correction.  HbgA1C:  A1C 11.9% on 03/22/20 indicating an average glucose of 295 mg/dl over the past 2-3 months. In reviewing chart, noted prior A1C 10.4% on 02/17/20 when patient last seen Dr. Fulton Mole and patient was prescribed Invokana which was changed to Jardiance due to insurance coverage. Given patient was already having urinary frequency and recurrent UTIs, would recommend discontinuing Jardiance at time of discharge.  NOTE: Per chart, patient has dementia and admitted with acute metabolic encephalopathy, weakness, chronic UTIs, and acute on chronic kidney disease. Initial glcose 438 mg/dl on 03/21/20 and A1C 11.9% on 03/22/20. Noted office visit note on 02/17/20 by Dr. Fulton Mole which notes that patient's A1C was 10.4% on 02/17/20 and  patient was prescribed Invokana 100 mg daily which was changed to Jardiance 10 mg daily (due to insurance). Would recommend discontinuing Jardiance at discharge given patient has chronic recurrent UTIs.  Will plan to talk with patient and her husband.  Addendum 03/23/20@14 :05-Went to patient's room to talk with patient and husband regarding DM. Patient was receiving care by nursing staff and patient's daughter was waiting outside room and noted that patient would not be able to provide any information and that patient's husband was at home if I wanted to call. Called patient's husband Mallie Mussel Hartwell) over the phone. Mr. Massoud reports that patient does not check glucose at home as patient would complain about finger sticks and so he did not insist that glucose be checked. He reports that patient was taking Lantus 5 units QHS and was started on Jardiance on 02/18/20. He reports that previously patient was taking Lantus 12 units and Amaryl but her PCP decreased the Lantus to 5 units QHS and stopped the Amaryl 4-5 months ago. He reports that over the past [place] days prior to admission, patient did not take her Lantus or Jardiance. Discussed current A1C 11.9% on 03/22/20 indicating an average glucose of 295 mg/dl over the past 2-3 months which is increased from 10.4% on 02/17/20. Mr. Levitan reports that patient has not been wanting to eat much other than fresh vegetables and fresh fruits recently. Discussed impact of nutrition, exercise, stress, sickness, and medications on diabetes control.   Discussed glucose and A1C goals. Discussed importance of checking CBGs and maintaining good CBG control to  prevent long-term and short-term complications. Discussed FreeStyle Libre CGM which would allow patient's glucose to be checked frequently without finger sticks. Encouraged him to discuss with patient's PCP if interested. Also discussed side effect of UTI with Jardiance and informed him that it would be recommended that Jardiance be  discontinued as an outpatient but it would up to the attending provider as to whether it would be discontinued or not. Explained that patient will likely need changes made with DM regimen and glucose would need to be monitored to ensure changes were appropriate.  Mr. Hacker states he will be sure to start checking patient's glucose consistently and he is working on getting a live in aide to assist with patient's care.  Mr. Tanguma verbalized understanding of information discussed and states he has no questions at this time.   Thanks, Barnie Alderman, RN, MSN, CDE Diabetes Coordinator Inpatient Diabetes Program [phone] (Team Pager from 8am to 5pm)

## 2020-03-23 NOTE — Progress Notes (Addendum)
PROGRESS NOTE    Sharon Dickson  FTD:[id number] DOB: [birth year] DOA: 03/22/2020 PCP: Ezequiel Kayser, MD    Chief Complaint  Patient presents with  . Altered Mental Status    Brief Narrative:  HPI per Dr. Priscella Mann Mallon is an 86 y.o. female with PMH significant for Lewy body dementia, HTN, DM2, CKD 3, anxiety and chronic UTIs who is brought in by her husband for weakness for the past 24 to 48 hours.  At baseline patient apparently has pretty significant dementia, sometimes is unable to recognize her husband however she is physically mobile and is able to go to the bathroom herself.  3 weeks ago she fell and injured her wrist and since then has been much more sedentary but is still been walking to the bathroom and kitchen.  Last week she was treated for a urinary tract infection by her PCP at [facility].  2 to 3 days ago patient's husband noted that she was markedly more lethargic.  She was not eating and not taking her medications.  He noted "I had to almost lift her to get her to the bathroom".  She continues to be confused but he believes this is most likely from dementia.  It is her lethargy and weakness and unwillingness to cooperate that is concerning to him.  He is not sure why there has been a change in her symptomatology.  He denies that she has had any fevers or chills or sweats.  He has not had a new cough according to him.  He has not had diarrhea as far as he is aware.  He has not had any vomiting and as noted previously has had decreased p.o. intake over the past 24 to 48 hours.  ED Course:  The patient was noted to be afebrile and markedly hypertensive.  She was saturating well on room air.  She was unable to provide a history.  Physical exam was unrevealing.  Laboratory data were suggestive of dehydration with new AKI.  Urinalysis was noted to be dirty however it had multiple squamous epithelial cells.  Ceftriaxone was nonetheless ordered.  She was given 1  L LR.  Troponin was noted to be elevated from 63 up to 85.  She underwent a renal stone protocol CT which was negative for renal stone or obstructive uropathy.  Assessment & Plan:   Principal Problem:   Acute metabolic encephalopathy Active Problems:   Anxiety   Chronic kidney disease, stage III (moderate)   Lewy body dementia (HCC)   Hypertension associated with type 2 diabetes mellitus (HCC)   Hyperlipidemia associated with type 2 diabetes mellitus (HCC)   Gait instability  1 generalized weakness/acute metabolic encephalopathy Patient noted to have presented with generalized weakness and lethargy.  Patient with history of chronic UTIs.  Concern for infectious etiology.  Patient generalized weakness likely secondary to intravascular volume depletion leading to acute kidney injury.  Patient noted to have decreased p.o. intake over the past 2 days questionable etiology.  Patient noted also to have been treated for UTI.  Urinalysis done had multiple squamous epithelial cells, negative for leukocyte esterase, negative for nitrites, no WBCs.  Patient noted to be significantly hyperglycemic on admission and per admitting physician has been did admit that patient had not been taking her diabetic medications for several days.  Is felt patient's hyperglycemia likely led to a diuresis leading to intravascular volume depletion and acute kidney injury.  PT OT ordered and pending.  Continue IV fluids  with normal saline at 100 cc/h.  Supportive care.  Follow.  2.  Chronic UTIs Patient with recent treatment for UTI.  Urinalysis nitrite negative, leukocytes negative, no WBCs, multiple bacteria.  Patient noted per admitting physician to have some asymptomatic bacteriuria alternating with UTIs.  Status post 1 dose IV Rocephin in the ED.  Urine cultures with less than 10,000 colonies of growth.  Patient Jardiance likely needs to be discontinued on discharge.  Supportive care.  Follow.  3.  Dehydration IV  fluids.  4.  Hyponatremia Improved with hydration.  Continue IV fluids.  5.  Diabetes mellitus type 2 Patient noted to have elevated blood glucose levels on admission with CBGs as high as 397.  Elevated blood glucose levels likely secondary to medication noncompliance.  CBG this morning at 220.  Lantus 8 units daily this morning ordered and dose has been increased to 10 units daily.  Sliding scale insulin.  Likely discontinue Jardiance on discharge.  Outpatient follow-up.  Diabetic coordinator following.  6.  Hypertension Continue Norvasc, Inderal.  7.  Gastroesophageal reflux disease/large hiatal hernia PPI.  8.  Lewy body dementia Per admitting physician slowly progressive per patient's husband.  Continue Cymbalta.  Outpatient follow-up.  9.  Mild hyperkalemia Resolved.  Continue to hold ARB..  Follow.  10.  Acute kidney injury on chronic kidney disease stage III Likely secondary to prerenal azotemia in the setting of ARB.  Renal function improving with hydration.  Avoid nephrotoxins.  Follow.    DVT prophylaxis: Lovenox Code Status: Full Family Communication: updated husband via telephone Disposition:   Status is inpatient.    Dispo: The patient is from: Home              Anticipated d/c is to: Likely home with home health              Anticipated d/c date is: Hopefully 1 to 2 days.              Patient currently not medically stable for discharge.       Consultants:   None  Procedures:   CT renal stone protocol 03/22/2020  CT head 03/21/2020  Chest x-ray 03/22/2020    Antimicrobials:   IV Rocephin 831 2021x1 dose.   Subjective: Patient sitting up in bed.  Alert this morning.  Denies any shortness of breath.  Denies any chest pain.  States some improvement with her weakness.  Some lower abdominal discomfort which it is noted overnight that Barrick cream was placed in patient's perineal area and patient able to tolerate purwick  better.  Objective: Vitals:   03/23/20 0024 03/23/20 0224 03/23/20 0530 03/23/20 0541  BP:  (!) 166/82 (!) 193/69 (!) 155/59  Pulse: 70 74 66 60  Resp:  18 20   Temp:  98.2 F (36.8 C) 98.4 F (36.9 C)   TempSrc:  Oral Oral   SpO2:  100% 99%   Weight:      Height:        Intake/Output Summary (Last 24 hours) at 03/23/2020 1311 Last data filed at 03/23/2020 1218 Gross per 24 hour  Intake 2905.42 ml  Output 700 ml  Net 2205.42 ml   Filed Weights   03/21/20 2034  Weight: 47.2 kg    Examination:  General exam: Appears calm and comfortable  Respiratory system: Clear to auscultation anterior lung fields. Respiratory effort normal. Cardiovascular system: S1 & S2 heard, RRR. No JVD, murmurs, rubs, gallops or clicks. No pedal edema. Gastrointestinal system:  Abdomen is nondistended, soft and some tenderness to palpation the lower abdominal region.  Positive bowel sounds.  No rebound.  No guarding.   Central nervous system: Alert. No focal neurological deficits. Extremities: Symmetric 5 x 5 power. Skin: No rashes, lesions or ulcers Psychiatry: Judgement and insight appear poor to fair. Mood & affect appropriate.     Data Reviewed: I have personally reviewed following labs and imaging studies  CBC: Recent Labs  Lab 03/21/20 2054 03/22/20 1050 03/23/20 0637  WBC 10.2 9.3 8.5  HGB 13.5 12.2 11.7*  HCT 39.1 36.4 34.6*  MCV 96.1 96.3 97.7  PLT 377 254 220    Basic Metabolic Panel: Recent Labs  Lab 03/21/20 2054 03/22/20 1050 03/23/20 0637  NA 133*  --  136  K 5.2*  --  4.4  CL 100  --  105  CO2 18*  --  20*  GLUCOSE 438*  --  287*  BUN 42*  --  29*  CREATININE 1.63* 1.08* 1.19*  CALCIUM 9.5  --  8.6*    GFR: Estimated Creatinine Clearance: 26.7 mL/min (A) (by C-G formula based on SCr of 1.19 mg/dL (H)).  Liver Function Tests: Recent Labs  Lab 03/21/20 2054  AST 19  ALT 14  ALKPHOS 73  BILITOT 1.8*  PROT 7.3  ALBUMIN 3.8    CBG: Recent Labs  Lab  03/22/20 1329 03/22/20 1806 03/23/20 0231 03/23/20 0801 03/23/20 1159  GLUCAP 397* 300* 220* 284* 301*     Recent Results (from the past 240 hour(s))  Urine culture     Status: Abnormal   Collection Time: 03/21/20 10:10 PM   Specimen: In/Out Cath Urine  Result Value Ref Range Status   Specimen Description   Final    IN/OUT CATH URINE Performed at [facility], [address], [place], [place] [address]    Special Requests   Final    NONE Performed at [facility], [address], [place], [place] [address]    Culture (A)  Final    <10,000 COLONIES/mL INSIGNIFICANT GROWTH Performed at [facility], [place] [address] [place], [place] [address]    Report Status 03/23/2020 FINAL  Final  SARS Coronavirus 2 by RT PCR (hospital order, performed in [facility] hospital lab) Nasopharyngeal Nasopharyngeal Swab     Status: None   Collection Time: 03/22/20  4:00 AM   Specimen: Nasopharyngeal Swab  Result Value Ref Range Status   SARS Coronavirus 2 NEGATIVE NEGATIVE Final    Comment: (NOTE) SARS-CoV-2 target nucleic acids are NOT DETECTED.  The SARS-CoV-2 RNA is generally detectable in upper and lower respiratory specimens during the acute phase of infection. The lowest concentration of SARS-CoV-2 viral copies this assay can detect is 250 copies / mL. A negative result does not preclude SARS-CoV-2 infection and should not be used as the sole basis for treatment or other patient management decisions.  A negative result may occur with improper specimen collection / handling, submission of specimen other than nasopharyngeal swab, presence of viral mutation(s) within the areas targeted by this assay, and inadequate number of viral copies (<250 copies / mL). A negative result must be combined with clinical observations, patient history, and epidemiological information.  Fact Sheet for Patients:   StrictlyIdeas.no  Fact Sheet for  Healthcare Providers: BankingDealers.co.za  This test is not yet approved or  cleared by the [place] FDA and has been authorized for detection and/or diagnosis of SARS-CoV-2 by FDA under an Emergency Use  Authorization (EUA).  This EUA will remain in effect (meaning this test can be used) for the duration of the COVID-19 declaration under Section 564(b)(1) of the Act, 21 U.S.C. section 360bbb-3(b)(1), unless the authorization is terminated or revoked sooner.  Performed at [facility], [address] [place], [place] [address]   Blood Culture (routine x 2)     Status: None (Preliminary result)   Collection Time: 03/22/20  7:41 AM   Specimen: BLOOD  Result Value Ref Range Status   Specimen Description BLOOD RIGHT ARM  Final   Special Requests   Final    BOTTLES DRAWN AEROBIC AND ANAEROBIC Blood Culture adequate volume   Culture   Final    NO GROWTH < 24 HOURS Performed at [facility], [address] [place], [place] [address]    Report Status PENDING  Incomplete  Blood Culture (routine x 2)     Status: None (Preliminary result)   Collection Time: 03/22/20  7:41 AM   Specimen: BLOOD  Result Value Ref Range Status   Specimen Description BLOOD RIGHT ANTECUBITAL  Final   Special Requests   Final    BOTTLES DRAWN AEROBIC AND ANAEROBIC Blood Culture adequate volume   Culture   Final    NO GROWTH < 24 HOURS Performed at [facility], [address], [place], [place] [address]    Report Status PENDING  Incomplete         Radiology Studies: CT Head Wo Contrast  Result Date: 03/21/2020 CLINICAL DATA:  Mental status change, unknown cause EXAM: CT HEAD WITHOUT CONTRAST TECHNIQUE: Contiguous axial images were obtained from the base of the skull through the vertex without intravenous contrast. COMPARISON:  CT 06/25/2019 FINDINGS: Brain: No evidence of acute infarction, hemorrhage, hydrocephalus, extra-axial collection or mass  lesion/mass effect. Symmetric prominence of the ventricles, cisterns and sulci compatible with moderate parenchymal volume loss and ex vacuo dilatation of the ventricles, overall similar to comparison. Patchy areas of white matter hypoattenuation are most compatible with chronic microvascular angiopathy. Vascular: Atherosclerotic calcification of the carotid siphons. No hyperdense vessel. Skull: No calvarial fracture or suspicious osseous lesion. No scalp swelling or hematoma. Sinuses/Orbits: Paranasal sinuses and mastoid air cells are predominantly clear. Included orbital structures are unremarkable. Other: None. IMPRESSION: 1. No acute intracranial findings. 2. Stable moderate parenchymal volume loss and chronic microvascular angiopathy. Electronically Signed   By: Lovena Le M.D.   On: 03/21/2020 22:27   DG Chest Port 1 View  Result Date: 03/22/2020 CLINICAL DATA:  Questionable sepsis. EXAM: PORTABLE CHEST 1 VIEW COMPARISON:  07/08/2018. FINDINGS: Mediastinum hilar structures normal. Mild bilateral subsegmental atelectasis and or scarring again noted. No focal alveolar infiltrate. No pleural effusion or pneumothorax. Heart size stable. Old right clavicular fracture again noted. IMPRESSION: Mild bilateral subsegmental atelectasis and or scarring again noted. No acute infiltrate noted. Electronically Signed   By: Marcello Moores  Register   On: 03/22/2020 07:02   CT Renal Stone Study  Result Date: 03/22/2020 CLINICAL DATA:  Recent UTI. EXAM: CT ABDOMEN AND PELVIS WITHOUT CONTRAST TECHNIQUE: Multidetector CT imaging of the abdomen and pelvis was performed following the standard protocol without IV contrast. COMPARISON:  04/13/2015 FINDINGS: Lower chest: Moderate to large hiatal hernia. Hepatobiliary: No focal abnormality in the liver on this study without intravenous contrast. Nonvisualization of the gallbladder suggest prior cholecystectomy. Extrahepatic common bile duct measures 8 mm diameter, stable since prior  study from 5 years ago. Pancreas: No focal mass lesion. No dilatation of the main  duct. No intraparenchymal cyst. No peripancreatic edema. Spleen: No splenomegaly. No focal mass lesion. Adrenals/Urinary Tract: No adrenal nodule or mass. No evidence for renal stones. No gross renal mass lesion on this noncontrast exam. No hydroureteronephrosis. No ureteral stones. Prominent amount of gas identified in the urinary bladder. No substantial bladder wall thickening appreciated on this noncontrast study. Stomach/Bowel: Moderate to large hiatal hernia with approximately 25-50% of the stomach in the chest. Duodenum is normally positioned as is the ligament of Treitz. No small bowel wall thickening. No small bowel dilatation. The terminal ileum is normal. The appendix is not visualized, but there is no edema or inflammation in the region of the cecum. No gross colonic mass. No colonic wall thickening. Diverticular changes are noted in the left colon without evidence of diverticulitis. Vascular/Lymphatic: There is abdominal aortic atherosclerosis without aneurysm. There is no gastrohepatic or hepatoduodenal ligament lymphadenopathy. No retroperitoneal or mesenteric lymphadenopathy. No pelvic sidewall lymphadenopathy. Reproductive: Uterus surgically absent.  There is no adnexal mass. Other: No intraperitoneal free fluid. Musculoskeletal: No worrisome lytic or sclerotic osseous abnormality. Degenerative disc disease noted lumbar spine. IMPRESSION: 1. Prominent amount of gas identified in the urinary bladder. This is presumably related to recent instrumentation although infection could have this appearance. 2. No evidence for urinary stone disease. No hydroureteronephrosis. 3. Extrahepatic common bile duct dilatation up to 8 mm, unchanged since 04/13/2015. 4. Moderate to large hiatal hernia. 5. Left colonic diverticulosis without diverticulitis. 6. Aortic Atherosclerosis (ICD10-I70.0). Electronically Signed   By: Misty Stanley  M.D.   On: 03/22/2020 07:42        Scheduled Meds: . amLODipine  5 mg Oral Daily  . aspirin EC  81 mg Oral QHS  . DULoxetine  30 mg Oral Daily  . enoxaparin (LOVENOX) injection  30 mg Subcutaneous Q24H  . insulin aspart  0-9 Units Subcutaneous TID WC  . [START ON 03/24/2020] insulin glargine  10 Units Subcutaneous Daily  . pantoprazole  40 mg Oral Daily  . propranolol  40 mg Oral BID   Continuous Infusions: . sodium chloride       LOS: 0 days    Time spent: 35 minutes    Irine Seal, MD [facility]   To contact the attending provider between 7A-7P or the covering provider during after hours 7P-7A, please log into the web site [email or web address] and access using universal [place] password for that web site. If you do not have the password, please call the hospital operator.  03/23/2020, 1:11 PM

## 2020-03-23 NOTE — Progress Notes (Signed)
Anticoagulation monitoring(Lovenox):  85yo  F ordered Lovenox 40 mg Q24h  Filed Weights   03/21/20 2034  Weight: 47.2 kg (104 lb)   BMI 17   Lab Results  Component Value Date   CREATININE 1.19 (H) 03/23/2020   CREATININE 1.08 (H) 03/22/2020   CREATININE 1.63 (H) 03/21/2020   Estimated Creatinine Clearance: 26.7 mL/min (A) (by C-G formula based on SCr of 1.19 mg/dL (H)). Hemoglobin & Hematocrit     Component Value Date/Time   HGB 11.7 (L) 03/23/2020 0637   HCT 34.6 (L) 03/23/2020 5830     Per Protocol for Patient with estCrcl < 30 ml/min and BMI < 40, will transition to Lovenox 30 mg Q24h      Chinita Greenland PharmD Clinical Pharmacist 03/23/2020

## 2020-03-23 NOTE — Evaluation (Signed)
Occupational Therapy Evaluation Patient Details Name: Sharon Dickson MRN: [id number] DOB: 1934/01/10 Today's Date: 03/23/2020    History of Present Illness Sharon Dickson is an 86 y.o. female with PMH significant for Lewy body dementia, HTN, DM2, CKD 3, anxiety and chronic UTIs who is brought in by her husband for weakness for the past 24 to 48 hours.  At baseline patient apparently has pretty significant dementia, sometimes is unable to recognize her husband however she is physically mobile and is able to go to the bathroom herself.  3 weeks ago she fell and injured her wrist (hard cast in place) and since then has been much more sedentary but is still been walking to the bathroom and kitchen.  Last week she was treated for a urinary tract infection by her PCP at [place].  2 to 3 days ago patient's husband noted that she was markedly more lethargic.   Clinical Impression   Sharon Dickson was seen for OT evaluation this date. Prior to hospital admission, pt was living with her spouse and generally required some assist for BADL management 2/2 baseline cognitive deficits from hx of Lewy Body Dementia. Per pt daughter who provides PLOF/home set-up information pt is generally able to perform toileting and some dressing tasks independently, but requires assist with bathing and has had multiple falls in the last 6 months. Per daughter, pt should be using a walker for mobility, however she does not consistently use it in the home or when getting up at night. Currently pt demonstrates impairments as described below (See OT problem list) which functionally limit her ability to perform ADL/self-care tasks. Pt requires supervision to CGA for functional mobility and LB ADL management with consistent cueing for sequencing and re-direction to task. Pt would benefit from skilled OT services to address noted impairments and functional limitations (see below for any additional details) in order to maximize  safety and independence while minimizing falls risk and caregiver burden. Upon hospital discharge, recommend HHOT to maximize pt safety and return to functional independence during meaningful occupations of daily life.    Follow Up Recommendations  Home health OT;Supervision/Assistance - 24 hour    Equipment Recommendations  3 in 1 bedside commode    Recommendations for Other Services       Precautions / Restrictions Precautions Precautions: Fall Precaution Comments: Pt has hard cast on LUE; no additional precautions listed. Per family was put on by [facility] ~3 weeks ago after pt fell and broke her wrist. Restrictions Other Position/Activity Restrictions: Pt has hard cast on LUE; no additional precautions listed.      Mobility Bed Mobility Overal bed mobility: Needs Assistance Bed Mobility: Supine to Sit     Supine to sit: Min assist;HOB elevated     General bed mobility comments: Req cueing to sequence movements. Min A for trunk elevation and to advance hips to EOB.  Transfers Overall transfer level: Needs assistance Equipment used: 1 person hand held assist Transfers: Sit to/from Stand Sit to Stand: Min guard              Balance Overall balance assessment: Needs assistance Sitting-balance support: Feet supported;No upper extremity supported Sitting balance-Sharon Dickson: Fair     Standing balance support: During functional activity Standing balance-Sharon Dickson: Fair                             ADL either performed or assessed with clinical judgement   ADL  Overall ADL's : Needs assistance/impaired                                       General ADL Comments: Pt functionally limited 2/2 impaired cognition and decreased functional use of her LUE. She is able to perform bed mobility with MIN A for trunk elevation and functional mobility with CGA for safety. Requires set-up assist for self-feeding items from meal tray. Pt able to reach feet in  seated position to doff/don bilat hospital socks with supervision for safety this date. Anticipate supervision to CGA for safety during LB ADL management     Vision Baseline Vision/History: Wears glasses Wears Glasses: Reading only Patient Visual Report: No change from baseline       Perception     Praxis      Pertinent Vitals/Pain Pain Assessment: No/denies pain     Hand Dominance     Extremity/Trunk Assessment Upper Extremity Assessment Upper Extremity Assessment: LUE deficits/detail;RUE deficits/detail RUE Deficits / Details: WFL LUE Deficits / Details: In hard cast. Treated as NWB   Lower Extremity Assessment Lower Extremity Assessment: Generalized weakness;Difficult to assess due to impaired cognition       Communication Communication Communication: HOH   Cognition Arousal/Alertness: Awake/alert Behavior During Therapy: Restless;WFL for tasks assessed/performed Overall Cognitive Status: History of cognitive impairments - at baseline                                 General Comments: Pt oriented to self only. Daughter presents to bedside during evaluation and states this is baseline. Pt is able to follow 1 step VCs with min increased prompting. Decreased safety awareness, decreased awareness of deficits.   General Comments       Exercises Other Exercises Other Exercises: Pt/caregiver educated on role of OT in acute setting, safe use of AE/DME for ADL management, and safe transfer techniques. Other Exercises: OT facilitates sup<>sit, transfer to chair, and set-up of lunch tray. See ADL section for additional details.   Shoulder Instructions      Home Living Family/patient expects to be discharged to:: Private residence Living Arrangements: Spouse/significant other Available Help at Discharge: Family;Available 24 hours/day Type of Home: House Home Access: Stairs to enter [employer] of Steps: 4 Entrance Stairs-Rails: Left;Right (at  front) Home Layout: One level               Home Equipment: Walker - 2 wheels          Prior Functioning/Environment Level of Independence: Needs assistance  Gait / Transfers Assistance Needed: Per daughter, pt should be useing RW in the home but frequently does not use it. Multiple falls (at least 3) in last 6 months. One of which resulted in wrist fracture. ADL's / Homemaking Assistance Needed: Per daughter, pt requires at least some assist with ADL management at baseline.            OT Problem List: Decreased strength;Decreased coordination;Decreased cognition;Decreased safety awareness;Impaired balance (sitting and/or standing);Decreased knowledge of use of DME or AE      OT Treatment/Interventions: Self-care/ADL training;Therapeutic exercise;Therapeutic activities;DME and/or AE instruction;Patient/family education;Balance training    OT Goals(Current goals can be found in the care plan section) Acute Rehab OT Goals Patient Stated Goal: To find my hearing aid OT Goal Formulation: With patient Time For Goal Achievement: 04/06/20 Potential to Achieve  Goals: Good ADL Goals Pt Will Perform Grooming: sitting;with set-up;with supervision Pt Will Perform Lower Body Dressing: with supervision;with set-up;sit to/from stand Pt Will Transfer to Toilet: bedside commode;with supervision;with set-up Additional ADL Goal #1: Pt/caregivers will verbalize a plan to implement at least 3 learned falls prevention strategies into daily routines/home environment for improved safety and functional indep upon hospital DC.  OT Frequency: Min 1X/week   Barriers to D/C: Decreased caregiver support          Co-evaluation              AM-PAC OT "6 Clicks" Daily Activity     Outcome Measure Help from another person eating meals?: A Little Help from another person taking care of personal grooming?: A Little Help from another person toileting, which includes using toliet, bedpan, or  urinal?: A Little Help from another person bathing (including washing, rinsing, drying)?: A Little Help from another person to put on and taking off regular upper body clothing?: A Little Help from another person to put on and taking off regular lower body clothing?: A Little 6 Click Score: 18   End of Session Equipment Utilized During Treatment: Gait belt Nurse Communication: Mobility status  Activity Tolerance: Patient tolerated treatment well Patient left: in chair;with call bell/phone within reach;with chair alarm set;with family/visitor present  OT Visit Diagnosis: Other abnormalities of gait and mobility (R26.89)                Time: 1338-1410 OT Time Calculation (min): 32 min Charges:  OT General Charges $OT Visit: 1 Visit OT Evaluation $OT Eval Moderate Complexity: 1 Mod OT Treatments $Self Care/Home Management : 8-22 mins  Shara Blazing, M.S., OTR/L Ascom: [phone] 03/23/20, 3:01 PM

## 2020-03-24 LAB — CBC WITH DIFFERENTIAL/PLATELET
Abs Immature Granulocytes: 0.07 10*3/uL (ref 0.00–0.07)
Basophils Absolute: 0 10*3/uL (ref 0.0–0.1)
Basophils Relative: 0 %
Eosinophils Absolute: 0.5 10*3/uL (ref 0.0–0.5)
Eosinophils Relative: 5 %
HCT: 35.8 % — ABNORMAL LOW (ref 36.0–46.0)
Hemoglobin: 12.2 g/dL (ref 12.0–15.0)
Immature Granulocytes: 1 %
Lymphocytes Relative: 19 %
Lymphs Abs: 1.9 10*3/uL (ref 0.7–4.0)
MCH: 33 pg (ref 26.0–34.0)
MCHC: 34.1 g/dL (ref 30.0–36.0)
MCV: 96.8 fL (ref 80.0–100.0)
Monocytes Absolute: 0.9 10*3/uL (ref 0.1–1.0)
Monocytes Relative: 9 %
Neutro Abs: 6.6 10*3/uL (ref 1.7–7.7)
Neutrophils Relative %: 66 %
Platelets: 358 10*3/uL (ref 150–400)
RBC: 3.7 MIL/uL — ABNORMAL LOW (ref 3.87–5.11)
RDW: 12.3 % (ref 11.5–15.5)
WBC: 10 10*3/uL (ref 4.0–10.5)
nRBC: 0 % (ref 0.0–0.2)

## 2020-03-24 LAB — BASIC METABOLIC PANEL
Anion gap: 11 (ref 5–15)
BUN: 20 mg/dL (ref 8–23)
CO2: 22 mmol/L (ref 22–32)
Calcium: 8.9 mg/dL (ref 8.9–10.3)
Chloride: 103 mmol/L (ref 98–111)
Creatinine, Ser: 0.77 mg/dL (ref 0.44–1.00)
GFR calc Af Amer: >60 mL/min (ref >60)
GFR calc non Af Amer: >60 mL/min (ref >60)
Glucose, Bld: 261 mg/dL — ABNORMAL HIGH (ref 70–99)
Potassium: 4 mmol/L (ref 3.5–5.1)
Sodium: 136 mmol/L (ref 135–145)

## 2020-03-24 LAB — GLUCOSE, CAPILLARY
Glucose-Capillary: 242 mg/dL — ABNORMAL HIGH (ref 70–99)
Glucose-Capillary: 215 mg/dL — ABNORMAL HIGH (ref 70–99)

## 2020-03-24 LAB — LACTIC ACID, PLASMA: Lactic Acid, Venous: 1.5 mmol/L (ref 0.5–1.9)

## 2020-03-24 MED ORDER — ENOXAPARIN SODIUM 40 MG/0.4ML ~~LOC~~ SOLN: 40.0000 mg | SUBCUTANEOUS | Status: DC

## 2020-03-24 MED ORDER — LOSARTAN POTASSIUM 100 MG PO TABS: 50.0000 mg | ORAL_TABLET | Freq: Every evening | ORAL | 0 refills | Status: AC

## 2020-03-24 NOTE — Evaluation (Signed)
Physical Therapy Evaluation Patient Details Name: Sharon Dickson MRN: [id number] DOB: [birth year] Today's Date: 03/24/2020   History of Present Illness  Pt is an 86 y.o. female with PMH significant for Lewy body dementia, HTN, DM2, CKD 3, anxiety and chronic UTIs who was brought in by her husband for weakness.  Pt recently fell and injured her wrist (hard cast in place) and since then has been much more sedentary but is still been walking to the bathroom and kitchen.  Pt recently treated for a urinary tract infection by her PCP at [place] and has become markedly more lethargic.  MD assessment includes: Acute metabolic encephalopathy, generalized weakness, chronic UTIs, dehydration, hyponatremia, HTN, hiatal hernia, mild hyperkalemia, and AKI on CKD III.    Clinical Impression  Pt pleasantly confused during the session alert to self only but was able to follow most 1-step commands with extra cuing, mostly tactile.  Pt would not initiate therex movements but would actively assist once movement was initiated.  Pt demonstrated fair eccentric and concentric control with transfers and was able to amb 125' with HHA with no adverse symptoms.  Pt does have a history of falling and will benefit from HHPT services upon discharge to safely address deficits listed in patient problem list for decreased caregiver assistance and eventual return to PLOF.     Follow Up Recommendations Home health PT;Supervision/Assistance - 24 hour    Equipment Recommendations  None recommended by PT    Recommendations for Other Services       Precautions / Restrictions Precautions Precautions: Fall Precaution Comments: Pt has hard cast on LUE; no additional precautions listed. Per family was put on by [facility] ~3 weeks ago after pt fell and broke her wrist. Restrictions Weight Bearing Restrictions: No Other Position/Activity Restrictions: Pt has hard cast on LUE; no additional precautions listed.      Mobility   Bed Mobility Overal bed mobility: Needs Assistance Bed Mobility: Supine to Sit     Supine to sit: Min assist;HOB elevated     General bed mobility comments: Min A for BLE and trunk control and movement initiation  Transfers Overall transfer level: Needs assistance Equipment used: 1 person hand held assist Transfers: Sit to/from Stand Sit to Stand: Min guard;+2 safety/equipment         General transfer comment: Fair eccentric and concentric control  Ambulation/Gait Ambulation/Gait assistance: Min guard Gait Distance (Feet): 125 Feet Assistive device: 1 person hand held assist Gait Pattern/deviations: Step-through pattern;Decreased step length - right;Decreased step length - left Gait velocity: decreased   General Gait Details: Slow cadence but steady with ambulation with no adverse symptoms noted  Stairs            Wheelchair Mobility    Modified Rankin (Stroke Patients Only)       Balance Overall balance assessment: Needs assistance Sitting-balance support: Feet supported;No upper extremity supported Sitting balance-Leahy Scale: Good     Standing balance support: During functional activity;Single extremity supported Standing balance-Leahy Scale: Good Standing balance comment: No LOB during functional tasks in standing with HHA including during start/stops and with head turns                             Pertinent Vitals/Pain Pain Assessment: No/denies pain    Home Living Family/patient expects to be discharged to:: Private residence Living Arrangements: Spouse/significant other Available Help at Discharge: Family;Available 24 hours/day Type of Home: House Home Access: Stairs to  enter Entrance Stairs-Rails: [employer] of Steps: 4 Home Layout: One level Home Equipment: Walker - 2 wheels      Prior Function Level of Independence: Needs assistance   Gait / Transfers Assistance Needed: Per daughter, pt should be  useing RW in the home but frequently does not use it. Multiple falls (at least 3) in last 6 months. One of which resulted in wrist fracture.  ADL's / Homemaking Assistance Needed: Per daughter, pt requires at least some assist with ADL management at baseline.        Hand Dominance        Extremity/Trunk Assessment   Upper Extremity Assessment Upper Extremity Assessment: Defer to OT evaluation LUE Deficits / Details: In hard cast. Treated as NWB    Lower Extremity Assessment Lower Extremity Assessment: Generalized weakness       Communication   Communication: HOH  Cognition Arousal/Alertness: Awake/alert Behavior During Therapy: WFL for tasks assessed/performed Overall Cognitive Status: History of cognitive impairments - at baseline                                 General Comments: Pt able to follow 1-step commands with extra time and multimodal cuing      General Comments      Exercises Total Joint Exercises Heel Slides: AAROM;Both;10 reps Hip ABduction/ADduction: AAROM;Both;10 reps Straight Leg Raises: AAROM;Both;10 reps Other Exercises Other Exercises: Static and dynamic standing activities x 5 min for improved stabiltiy and activity tolerance   Assessment/Plan    PT Assessment Patient needs continued PT services  PT Problem List Decreased strength;Decreased activity tolerance;Decreased balance;Decreased mobility;Decreased safety awareness       PT Treatment Interventions DME instruction;Gait training;Stair training;Functional mobility training;Therapeutic activities;Therapeutic exercise;Balance training;Patient/family education    PT Goals (Current goals can be found in the Care Plan section)  Acute Rehab PT Goals PT Goal Formulation: Patient unable to participate in goal setting Time For Goal Achievement: 04/06/20 Potential to Achieve Goals: Fair    Frequency Min 2X/week   Barriers to discharge        Co-evaluation                AM-PAC PT "6 Clicks" Mobility  Outcome Measure Help needed turning from your back to your side while in a flat bed without using bedrails?: A Little Help needed moving from lying on your back to sitting on the side of a flat bed without using bedrails?: A Little Help needed moving to and from a bed to a chair (including a wheelchair)?: A Little Help needed standing up from a chair using your arms (e.g., wheelchair or bedside chair)?: A Little Help needed to walk in hospital room?: A Little Help needed climbing 3-5 steps with a railing? : A Little 6 Click Score: 18    End of Session Equipment Utilized During Treatment: Gait belt Activity Tolerance: Patient tolerated treatment well Patient left: in chair;with family/visitor present;with chair alarm set;with call bell/phone within reach Nurse Communication: Mobility status;Other (comment) (Pt has room phone but no call bell, nsg notified) PT Visit Diagnosis: History of falling (Z91.81);Muscle weakness (generalized) (M62.81);Difficulty in walking, not elsewhere classified (R26.2)    Time: 4259-5638 PT Time Calculation (min) (ACUTE ONLY): 34 min   Charges:   PT Evaluation $PT Eval Moderate Complexity: 1 Mod PT Treatments $Therapeutic Exercise: 8-22 mins        D. Scott Lashawna Poche PT, DPT 03/24/20, 11:19 AM

## 2020-03-24 NOTE — Progress Notes (Signed)
Pt discharged per MD order. IV removed. Discharge instructions reviewed with pts husband/caregiver. Husband verbalized understanding with all questions answered to satisfaction. Pt taken to car in wheelchair by staff.

## 2020-03-24 NOTE — Progress Notes (Signed)
Patient requested to lay in her bed.  RN transferred patient into her bed.

## 2020-03-24 NOTE — Discharge Summary (Signed)
Physician Discharge Summary [facility]    Patient: Sharon Dickson                   Admit date: 03/22/2020   DOB: 22-Oct-1933             Discharge date:03/24/2020/12:19 PM ULA:[id number]                          PCP: Ezequiel Kayser, MD  Disposition: HOME   Recommendations for Outpatient Follow-up:   . Follow up: in 1 week  Discharge Condition: Stable   Code Status:   Code Status: Full Code  Diet recommendation: Regular healthy diet   Discharge Diagnoses:    Principal Problem:   Acute metabolic encephalopathy Active Problems:   Anxiety   Chronic kidney disease, stage III (moderate)   Lewy body dementia ([place])   Hypertension associated with type 2 diabetes mellitus ([place])   Hyperlipidemia associated with type 2 diabetes mellitus ([place])   Gait instability   Acute kidney injury ([place])   Dehydration   History of Present Illness/ Hospital Course Kathleen Argue Summary:   HPI per Dr. Priscella Mann Fewis an 86 y.o.femalewith PMH significant for Lewy body dementia, HTN, DM2, CKD 3,anxiety and chronic UTIs who is brought in by her husband for weakness for the past 24 to 48 hours. At baseline patient apparently has pretty significant dementia, sometimes is unable to recognize her husband however she is physically mobile and is able to go to the bathroom herself. 3 weeks ago she fell and injured her wrist and since then has been much more sedentary but is still been walking to the bathroom and kitchen. Last week she was treated for a urinary tract infection by her PCP at [facility]. 2 to 3 days ago patient's husband noted that she was markedly more lethargic. She was not eating and not taking her medications. He noted "I had to almost lift her to get her to the bathroom". She continues to be confused but he believes this is most likely from dementia. It is her lethargy and weakness and unwillingness to cooperate that is concerning to him.  He is not  sure why there has been a change in her symptomatology. He denies that she has had any fevers or chills or sweats. He has not had a new cough according to him. He has not had diarrhea as far as he is aware. He has not had any vomiting and as noted previously has had decreased p.o. intake over the past 24 to 48 hours.  ED Course:The patient was noted to be afebrile and markedly hypertensive. She was saturating well on room air. She was unable to provide a history. Physical exam was unrevealing. Laboratory data were suggestive of dehydration with new AKI. Urinalysis was noted to be dirty however it had multiple squamous epithelial cells. Ceftriaxone was nonetheless ordered. She was given 1 L LR. Troponin was noted to be elevated from 63 up to 85. She underwent a renal stone protocol CT which was negative for renal stone or obstructive uropathy.   Social patient was admitted, started on gentle IV fluid hydration which she responded well.  UA was analyzed, cultures were obtained, no convincing evidence of UTI, patient received 1 dose of Rocephin in ED no significant growth on cultures. Patient responded well to hydration, currently back to baseline, ready to be discharged home Patient's home medication of losartan was reduced from 100mg  to 50  mg to be taken in next [place] days if blood pressure remains elevated.  The rest of home medication may be resumed with no changes    generalized weakness/acute metabolic encephalopathy Resolved back to baseline  On admission patient noted to have presented with generalized weakness and lethargy.   Patient with history of chronic UTIs.  Concern for infectious etiology.  Patient generalized weakness likely secondary to intravascular volume depletion leading to acute kidney injury.  Patient noted to have decreased p.o. intake over the past 2 days questionable etiology.  Patient noted also to have been treated for UTI.  Urinalysis done had multiple  squamous epithelial cells, negative for leukocyte esterase, negative for nitrites, no WBCs.  Patient noted to be significantly hyperglycemic on admission and per admitting physician has been did admit that patient had not been taking her diabetic medications for several days.  Is felt patient's hyperglycemia likely led to a diuresis leading to intravascular volume depletion and acute kidney injury  Chronic UTIs Antibiotic discontinued Patient with recent treatment for UTI.  Urinalysis nitrite negative, leukocytes negative, no WBCs, multiple bacteria.  Patient noted per admitting physician to have some asymptomatic bacteriuria alternating with UTIs.  Status post 1 dose IV Rocephin in the ED.  Urine cultures with less than 10,000 colonies of growth.  Patient Jardiance likely needs to be discontinued on discharge.  Supportive care.  Follow.  Dehydration Status post IV fluid hydration   Hyponatremia Likely due to dehydration, responded well to normal saline IV fluid    Diabetes mellitus type 2 -Recommending resuming diabetic likely responding well to regular diet since poor p.o. intake Continue home regimen insulin   Hypertension Continue Norvasc, Inderal. Losartan is to be held, may be resumed at half dose in next 2-3 days   Gastroesophageal reflux disease/large hiatal hernia PPI.   Lewy body dementia Per admitting physician slowly progressive per patient's husband.  Continue Cymbalta.  Outpatient follow-up.  Mild hyperkalemia Resolved.  Continue to hold ARB.Marland Kitchen     Acute kidney injury on chronic kidney disease stage III -Creatinine improved to baseline, ARB (losartan )has been held, dosage was reduced to half to be started in [place] days -Responded well to hydration  Avoid nephrotoxins.       Code Status: Full Family Communication:  Husband at bedside Disposition:   Status is inpatient.    Dispo: The patient is from: Home  Anticipated d/c is to:   Home with her husband      Consultants:   None  Procedures:   CT renal stone protocol 03/22/2020  CT head 03/21/2020  Chest x-ray 03/22/2020    Antimicrobials:   IV Rocephin 831 2021x1 dose   Discharge Instructions:   Discharge Instructions    Activity as tolerated - No restrictions   Complete by: As directed    Call MD for:  difficulty breathing, headache or visual disturbances   Complete by: As directed    Call MD for:  persistant dizziness or light-headedness   Complete by: As directed    Call MD for:  temperature >100.4   Complete by: As directed    Diet general   Complete by: As directed    Discharge instructions   Complete by: As directed    F/up with PCP   Increase activity slowly   Complete by: As directed        Medication List    TAKE these medications   acetaminophen 650 MG CR tablet Commonly known as: TYLENOL Take 650 mg  by mouth 3 (three) times daily as needed for pain.   acidophilus Caps capsule Take 1 capsule by mouth daily.   amLODipine 5 MG tablet Commonly known as: NORVASC Take 5 mg by mouth daily.   aspirin EC 81 MG tablet Take 81 mg by mouth at bedtime. Swallow whole.   cholecalciferol 1000 units tablet Commonly known as: VITAMIN D Take 1,000 Units by mouth every evening.   DULoxetine 30 MG capsule Commonly known as: CYMBALTA Take 30 mg by mouth daily.   Jardiance 10 MG Tabs tablet Generic drug: empagliflozin Take 10 mg by mouth daily.   Lantus SoloStar 100 UNIT/ML Solostar Pen Generic drug: insulin glargine Inject 12 Units into the skin at bedtime.   losartan 100 MG tablet Commonly known as: COZAAR Take 0.5 tablets (50 mg total) by mouth every evening. Start taking on: March 28, 2020 What changed:   how much to take  These instructions start on March 28, 2020. If you are unsure what to do until then, ask your doctor or other care provider.   omeprazole 20 MG capsule Commonly known as:  PRILOSEC Take 20 mg by mouth daily.   propranolol 40 MG tablet Commonly known as: INDERAL Take 40 mg by mouth 2 (two) times daily.       Allergies  Allergen Reactions  . Ceftin [Cefuroxime Axetil] Nausea And Vomiting  . Codeine Nausea And Vomiting  . Metformin Diarrhea  . Paxil [Paroxetine Hcl] Other (See Comments)    Reaction:  Unknown   . Prozac [Fluoxetine Hcl] Other (See Comments)    Reaction:  Dizziness      Procedures /Studies:   DG Wrist Complete Left  Result Date: 03/04/2020 CLINICAL DATA:  Left wrist pain and bruising following a fall this morning. EXAM: LEFT WRIST - COMPLETE 3+ VIEW COMPARISON:  None. FINDINGS: Oblique fracture of the distal radial metadiaphysis with dorsal angulation of the distal fragment without significant displacement. No intra-articular extension seen. There is also a fracture of the distal aspect of the ulnar styloid with mild radial displacement of the distal fragment. IMPRESSION: Distal radius and ulnar styloid fractures, as described above. Electronically Signed   By: Claudie Revering M.D.   On: 03/04/2020 14:22   CT Head Wo Contrast  Result Date: 03/21/2020 CLINICAL DATA:  Mental status change, unknown cause EXAM: CT HEAD WITHOUT CONTRAST TECHNIQUE: Contiguous axial images were obtained from the base of the skull through the vertex without intravenous contrast. COMPARISON:  CT 06/25/2019 FINDINGS: Brain: No evidence of acute infarction, hemorrhage, hydrocephalus, extra-axial collection or mass lesion/mass effect. Symmetric prominence of the ventricles, cisterns and sulci compatible with moderate parenchymal volume loss and ex vacuo dilatation of the ventricles, overall similar to comparison. Patchy areas of white matter hypoattenuation are most compatible with chronic microvascular angiopathy. Vascular: Atherosclerotic calcification of the carotid siphons. No hyperdense vessel. Skull: No calvarial fracture or suspicious osseous lesion. No scalp swelling  or hematoma. Sinuses/Orbits: Paranasal sinuses and mastoid air cells are predominantly clear. Included orbital structures are unremarkable. Other: None. IMPRESSION: 1. No acute intracranial findings. 2. Stable moderate parenchymal volume loss and chronic microvascular angiopathy. Electronically Signed   By: Lovena Le M.D.   On: 03/21/2020 22:27   DG Chest Port 1 View  Result Date: 03/22/2020 CLINICAL DATA:  Questionable sepsis. EXAM: PORTABLE CHEST 1 VIEW COMPARISON:  07/08/2018. FINDINGS: Mediastinum hilar structures normal. Mild bilateral subsegmental atelectasis and or scarring again noted. No focal alveolar infiltrate. No pleural effusion or pneumothorax. Heart size stable.  Old right clavicular fracture again noted. IMPRESSION: Mild bilateral subsegmental atelectasis and or scarring again noted. No acute infiltrate noted. Electronically Signed   By: Marcello Moores  Register   On: 03/22/2020 07:02   CT Renal Stone Study  Result Date: 03/22/2020 CLINICAL DATA:  Recent UTI. EXAM: CT ABDOMEN AND PELVIS WITHOUT CONTRAST TECHNIQUE: Multidetector CT imaging of the abdomen and pelvis was performed following the standard protocol without IV contrast. COMPARISON:  04/13/2015 FINDINGS: Lower chest: Moderate to large hiatal hernia. Hepatobiliary: No focal abnormality in the liver on this study without intravenous contrast. Nonvisualization of the gallbladder suggest prior cholecystectomy. Extrahepatic common bile duct measures 8 mm diameter, stable since prior study from 5 years ago. Pancreas: No focal mass lesion. No dilatation of the main duct. No intraparenchymal cyst. No peripancreatic edema. Spleen: No splenomegaly. No focal mass lesion. Adrenals/Urinary Tract: No adrenal nodule or mass. No evidence for renal stones. No gross renal mass lesion on this noncontrast exam. No hydroureteronephrosis. No ureteral stones. Prominent amount of gas identified in the urinary bladder. No substantial bladder wall thickening  appreciated on this noncontrast study. Stomach/Bowel: Moderate to large hiatal hernia with approximately 25-50% of the stomach in the chest. Duodenum is normally positioned as is the ligament of Treitz. No small bowel wall thickening. No small bowel dilatation. The terminal ileum is normal. The appendix is not visualized, but there is no edema or inflammation in the region of the cecum. No gross colonic mass. No colonic wall thickening. Diverticular changes are noted in the left colon without evidence of diverticulitis. Vascular/Lymphatic: There is abdominal aortic atherosclerosis without aneurysm. There is no gastrohepatic or hepatoduodenal ligament lymphadenopathy. No retroperitoneal or mesenteric lymphadenopathy. No pelvic sidewall lymphadenopathy. Reproductive: Uterus surgically absent.  There is no adnexal mass. Other: No intraperitoneal free fluid. Musculoskeletal: No worrisome lytic or sclerotic osseous abnormality. Degenerative disc disease noted lumbar spine. IMPRESSION: 1. Prominent amount of gas identified in the urinary bladder. This is presumably related to recent instrumentation although infection could have this appearance. 2. No evidence for urinary stone disease. No hydroureteronephrosis. 3. Extrahepatic common bile duct dilatation up to 8 mm, unchanged since 04/13/2015. 4. Moderate to large hiatal hernia. 5. Left colonic diverticulosis without diverticulitis. 6. Aortic Atherosclerosis (ICD10-I70.0). Electronically Signed   By: Misty [facility] M.D.   On: 03/22/2020 07:42     Subjective:   Patient was seen and examined 03/24/2020, 12:19 PM Patient stable today. No acute distress.  No issues overnight Stable for discharge.  Discharge Exam:    Vitals:   03/23/20 1408 03/23/20 1919 03/24/20 0351 03/24/20 1204  BP: 124/72 (!) 152/69 (!) 180/65 (!) 150/60  Pulse: 68 67 60 (!) 57  Resp: 15  16 20   Temp: (!) 97.5 F (36.4 C) 98.4 F (36.9 C) 97.7 F (36.5 C) (!) 97.5 F (36.4 C)  TempSrc:    Oral Oral  SpO2: 96% 93% 96% 100%  Weight:      Height:        General: Pt lying comfortably in bed & appears in no obvious distress. Cardiovascular: S1 & S2 heard, RRR, S1/S2 +. No murmurs, rubs, gallops or clicks. No JVD or pedal edema. Respiratory: Clear to auscultation without wheezing, rhonchi or crackles. No increased work of breathing. Abdominal:  Non-distended, non-tender & soft. No organomegaly or masses appreciated. Normal bowel sounds heard. CNS: Alert and oriented. No focal deficits. Extremities: no edema, no cyanosis    The results of significant diagnostics from this hospitalization (including imaging, microbiology, ancillary and  laboratory) are listed below for reference.      Microbiology:   Recent Results (from the past 240 hour(s))  Urine culture     Status: Abnormal   Collection Time: 03/21/20 10:10 PM   Specimen: In/Out Cath Urine  Result Value Ref Range Status   Specimen Description   Final    IN/OUT CATH URINE Performed at [facility], [address] [place], [place] [address]    Special Requests   Final    NONE Performed at [facility], [address] [place], [place] [address]    Culture (A)  Final    <10,000 COLONIES/mL INSIGNIFICANT GROWTH Performed at [facility] [address], [place], [place] [address]    Report Status 03/23/2020 FINAL  Final  SARS Coronavirus 2 by RT PCR (hospital order, performed in [facility] hospital lab) Nasopharyngeal Nasopharyngeal Swab     Status: None   Collection Time: 03/22/20  4:00 AM   Specimen: Nasopharyngeal Swab  Result Value Ref Range Status   SARS Coronavirus 2 NEGATIVE NEGATIVE Final    Comment: (NOTE) SARS-CoV-2 target nucleic acids are NOT DETECTED.  The SARS-CoV-2 RNA is generally detectable in upper and lower respiratory specimens during the acute phase of infection. The lowest concentration of SARS-CoV-2 viral copies this assay can detect is 250 copies / mL. A  negative result does not preclude SARS-CoV-2 infection and should not be used as the sole basis for treatment or other patient management decisions.  A negative result may occur with improper specimen collection / handling, submission of specimen other than nasopharyngeal swab, presence of viral mutation(s) within the areas targeted by this assay, and inadequate number of viral copies (<250 copies / mL). A negative result must be combined with clinical observations, patient history, and epidemiological information.  Fact Sheet for Patients:   StrictlyIdeas.no  Fact Sheet for Healthcare Providers: BankingDealers.co.za  This test is not yet approved or  cleared by the [place] FDA and has been authorized for detection and/or diagnosis of SARS-CoV-2 by FDA under an Emergency Use Authorization (EUA).  This EUA will remain in effect (meaning this test can be used) for the duration of the COVID-19 declaration under Section 564(b)(1) of the Act, 21 U.S.C. section 360bbb-3(b)(1), unless the authorization is terminated or revoked sooner.  Performed at [facility], [address] [place], [place] [address]   Blood Culture (routine x 2)     Status: None (Preliminary result)   Collection Time: 03/22/20  7:41 AM   Specimen: BLOOD  Result Value Ref Range Status   Specimen Description BLOOD RIGHT ARM  Final   Special Requests   Final    BOTTLES DRAWN AEROBIC AND ANAEROBIC Blood Culture adequate volume   Culture   Final    NO GROWTH 2 DAYS Performed at [facility], [address], [place], [place] [address]    Report Status PENDING  Incomplete  Blood Culture (routine x 2)     Status: None (Preliminary result)   Collection Time: 03/22/20  7:41 AM   Specimen: BLOOD  Result Value Ref Range Status   Specimen Description BLOOD RIGHT ANTECUBITAL  Final   Special Requests   Final    BOTTLES DRAWN AEROBIC AND ANAEROBIC  Blood Culture adequate volume   Culture   Final    NO GROWTH 2 DAYS Performed at [facility], [address] [place],  [address]    Report Status PENDING  Incomplete  Labs:   CBC: Recent Labs  Lab 03/21/20 2054 03/22/20 1050 03/23/20 0637 03/24/20 0529  WBC 10.2 9.3 8.5 10.0  NEUTROABS  --   --   --  6.6  HGB 13.5 12.2 11.7* 12.2  HCT 39.1 36.4 34.6* 35.8*  MCV 96.1 96.3 97.7 96.8  PLT 377 254 293 097   Basic Metabolic Panel: Recent Labs  Lab 03/21/20 2054 03/22/20 1050 03/23/20 0637 03/24/20 0529  NA 133*  --  136 136  K 5.2*  --  4.4 4.0  CL 100  --  105 103  CO2 18*  --  20* 22  GLUCOSE 438*  --  287* 261*  BUN 42*  --  29* 20  CREATININE 1.63* 1.08* 1.19* 0.77  CALCIUM 9.5  --  8.6* 8.9   Liver Function Tests: Recent Labs  Lab 03/21/20 2054  AST 19  ALT 14  ALKPHOS 73  BILITOT 1.8*  PROT 7.3  ALBUMIN 3.8   BNP (last 3 results) No results for input(s): BNP in the last 8760 hours. Cardiac Enzymes: No results for input(s): CKTOTAL, CKMB, CKMBINDEX, TROPONINI in the last 168 hours. CBG: Recent Labs  Lab 03/23/20 1159 03/23/20 1623 03/23/20 2122 03/24/20 0741 03/24/20 1201  GLUCAP 301* 261* 210* 242* 215*   Hgb A1c Recent Labs    03/22/20 1050  HGBA1C 11.9*   Lipid Profile No results for input(s): CHOL, HDL, LDLCALC, TRIG, CHOLHDL, LDLDIRECT in the last 72 hours. Thyroid function studies Recent Labs    03/22/20 1050  TSH 0.552   Anemia work up No results for input(s): VITAMINB12, FOLATE, FERRITIN, TIBC, IRON, RETICCTPCT in the last 72 hours. Urinalysis    Component Value Date/Time   COLORURINE YELLOW (A) 03/22/2020 0818   APPEARANCEUR CLOUDY (A) 03/22/2020 0818   LABSPEC 1.022 03/22/2020 0818   PHURINE 5.0 03/22/2020 0818   GLUCOSEU >=500 (A) 03/22/2020 0818   HGBUR NEGATIVE 03/22/2020 0818   BILIRUBINUR NEGATIVE 03/22/2020 0818   KETONESUR 5 (A) 03/22/2020 0818   PROTEINUR 100 (A) 03/22/2020 0818    NITRITE NEGATIVE 03/22/2020 0818   LEUKOCYTESUR NEGATIVE 03/22/2020 0818         Time coordinating discharge: Over 45 minutes  SIGNED: Deatra James, MD, FACP, FHM. [facility],  Please use [email or web address] to Page If 7PM-7AM, please contact night-coverage [email or web address] [email or web address] [facility] 03/24/2020, 12:19 PM

## 2020-03-24 NOTE — Progress Notes (Signed)
Patient confused, agitated, and pulling at IV's.  Very tearful, anxious and not wanting to be left alone.  RN change environment, sat patient with her at nurses station, patient calmed down and fell asleep in recliner. RN informed Rufina Falco, NP.

## 2020-03-24 NOTE — Progress Notes (Signed)
Anticoagulation monitoring(Lovenox):  85yo  F ordered Lovenox 30 mg Q24h  Filed Weights   03/21/20 2034  Weight: 47.2 kg (104 lb)   BMI 17   Lab Results  Component Value Date   CREATININE 0.77 03/24/2020   CREATININE 1.19 (H) 03/23/2020   CREATININE 1.08 (H) 03/22/2020   Estimated Creatinine Clearance: 39.7 mL/min (by C-G formula based on SCr of 0.77 mg/dL). Hemoglobin & Hematocrit     Component Value Date/Time   HGB 12.2 03/24/2020 0529   HCT 35.8 (L) 03/24/2020 0529     Per Protocol for Patient with estCrcl now > 30 ml/min and BMI < 40, will transition to Lovenox 40 mg Q24h      Chinita Greenland PharmD Clinical Pharmacist 03/24/2020

## 2020-03-24 NOTE — Progress Notes (Signed)
Inpatient Diabetes Program Recommendations  AACE/ADA: New Consensus Statement on Inpatient Glycemic Control  Target Ranges:  Prepandial:   less than 140 mg/dL      Peak postprandial:   less than 180 mg/dL (1-2 hours)      Critically ill patients:  140 - 180 mg/dL   Results for Sharon Dickson, Sharon Dickson (MRN [id number]) as of 03/24/2020 06:48  Ref. Range 03/23/2020 08:01 03/23/2020 11:59 03/23/2020 16:23 03/23/2020 21:22  Glucose-Capillary Latest Ref Range: 70 - 99 mg/dL 284 (H) 301 (H) 261 (H) 210 (H)   Review of Glycemic Control  Diabetes history: DM2 Outpatient Diabetes medications: Lantus 5 units QHS, Jardiance 10 mg daily Current orders for Inpatient glycemic control: Lantus 10 units daily,  Novolog 0-9 units TID with meals  Inpatient Diabetes Program Recommendations:    Insulin: Post prandial glucose consistently elevated.  Please consider ordering Novolog 3 units TID with meals if patient eats at least 50% of meals.  HbgA1C:  A1C 11.9% on 03/22/20 indicating an average glucose of 295 mg/dl over the past 2-3 months. Given patient has history of chronic recurrent UTIs, would recommend discontinuing Jardiance at time of discharge.  Thanks, Barnie Alderman, RN, MSN, CDE Diabetes Coordinator Inpatient Diabetes Program [phone] (Team Pager from 8am to 5pm)

## 2020-03-24 NOTE — TOC Transition Note (Signed)
Transition of Care [facility]) - CM/SW Discharge Note   Patient Details  Name: Sharon Dickson MRN: [id number] Date of Birth: [birth year]  Transition of Care [facility]) CM/SW Contact:  Candie Chroman, LCSW Phone Number: 03/24/2020, 12:25 PM   Clinical Narrative:  CSW met with patient. Husband at bedside. CSW introduced role and explained that therapy recommendations would be discussed. Husband declined home health at this time. He will reach out to her PCP if he changes his mind. Discussed DME recommendation for a 3-in-1. Husband is agreeable. CSW called [facility] to order and notified representative of plan for discharge today per MD. No further concerns. CSW signing off.   Final next level of care: Home/Self Care Barriers to Discharge: No Barriers Identified   Patient Goals and CMS Choice Patient states their goals for this hospitalization and ongoing recovery are:: Patient not fully oriented.      Discharge Placement                       Discharge Plan and Services     Post Acute Care Choice: Durable Medical Equipment          DME Arranged: 3-N-1 DME Agency: [employer] Date DME Agency Contacted: 03/24/20   Representative spoke with at DME Agency: Nash Shearer            Social Determinants of Health (SDOH) Interventions     Readmission Risk Interventions No flowsheet data found.

## 2020-03-27 LAB — CULTURE, BLOOD (ROUTINE X 2)
Culture: NO GROWTH
Culture: NO GROWTH
Special Requests: ADEQUATE
Special Requests: ADEQUATE

## 2023-03-24 DEATH — deceased
